# Patient Record
Sex: Male | Born: 1980 | Race: White | Hispanic: No | Marital: Married | Smoking: Never smoker
Health system: Southern US, Community
[De-identification: ages and names within clinical notes are randomized; demographics above are authoritative.]

## PROBLEM LIST (undated history)

## (undated) DIAGNOSIS — R011 Cardiac murmur, unspecified: Secondary | ICD-10-CM

## (undated) DIAGNOSIS — H269 Unspecified cataract: Secondary | ICD-10-CM

## (undated) DIAGNOSIS — R002 Palpitations: Secondary | ICD-10-CM

## (undated) DIAGNOSIS — Z87442 Personal history of urinary calculi: Secondary | ICD-10-CM

## (undated) DIAGNOSIS — K219 Gastro-esophageal reflux disease without esophagitis: Secondary | ICD-10-CM

## (undated) HISTORY — DX: Cardiac murmur, unspecified: R01.1

## (undated) HISTORY — DX: Unspecified cataract: H26.9

## (undated) HISTORY — DX: Palpitations: R00.2

## (undated) HISTORY — PX: APPENDECTOMY: SHX54

## (undated) HISTORY — PX: CATARACT EXTRACTION, BILATERAL: SHX1313

---

## 2004-03-28 ENCOUNTER — Ambulatory Visit: Payer: Self-pay | Admitting: General Surgery

## 2004-04-06 ENCOUNTER — Ambulatory Visit: Payer: Self-pay | Admitting: General Surgery

## 2006-11-05 ENCOUNTER — Emergency Department: Payer: Self-pay

## 2006-11-05 ENCOUNTER — Other Ambulatory Visit: Payer: Self-pay

## 2006-11-14 ENCOUNTER — Ambulatory Visit: Payer: Self-pay

## 2009-05-02 ENCOUNTER — Ambulatory Visit: Payer: Self-pay | Admitting: Family Medicine

## 2012-09-23 ENCOUNTER — Ambulatory Visit: Payer: Self-pay

## 2012-10-12 ENCOUNTER — Ambulatory Visit: Payer: Self-pay | Admitting: Emergency Medicine

## 2012-10-17 ENCOUNTER — Ambulatory Visit: Payer: Self-pay

## 2014-06-05 ENCOUNTER — Ambulatory Visit: Payer: Self-pay | Admitting: Family Medicine

## 2014-06-05 LAB — CBC WITH DIFFERENTIAL/PLATELET
BASOS ABS: 0.1 10*3/uL (ref 0.0–0.1)
Basophil %: 0.9 %
EOS ABS: 0.1 10*3/uL (ref 0.0–0.7)
Eosinophil %: 1.5 %
HCT: 46.2 % (ref 40.0–52.0)
HGB: 15.2 g/dL (ref 13.0–18.0)
LYMPHS ABS: 1.2 10*3/uL (ref 1.0–3.6)
LYMPHS PCT: 19.5 %
MCH: 28.8 pg (ref 26.0–34.0)
MCHC: 33 g/dL (ref 32.0–36.0)
MCV: 87 fL (ref 80–100)
Monocyte #: 0.8 x10 3/mm (ref 0.2–1.0)
Monocyte %: 12.3 %
NEUTROS PCT: 65.8 %
Neutrophil #: 4.2 10*3/uL (ref 1.4–6.5)
Platelet: 191 10*3/uL (ref 150–440)
RBC: 5.29 10*6/uL (ref 4.40–5.90)
RDW: 13.4 % (ref 11.5–14.5)
WBC: 6.4 10*3/uL (ref 3.8–10.6)

## 2014-06-05 LAB — COMPREHENSIVE METABOLIC PANEL
ALBUMIN: 4.1 g/dL (ref 3.4–5.0)
ALK PHOS: 105 U/L
Anion Gap: 9 (ref 7–16)
BUN: 10 mg/dL (ref 7–18)
Bilirubin,Total: 0.7 mg/dL (ref 0.2–1.0)
CALCIUM: 9.3 mg/dL (ref 8.5–10.1)
CHLORIDE: 102 mmol/L (ref 98–107)
CREATININE: 1 mg/dL (ref 0.60–1.30)
Co2: 29 mmol/L (ref 21–32)
EGFR (African American): >60
EGFR (Non-African Amer.): >60
Glucose: 97 mg/dL (ref 65–99)
Osmolality: 278 (ref 275–301)
Potassium: 4.2 mmol/L (ref 3.5–5.1)
SGOT(AST): 17 U/L (ref 15–37)
SGPT (ALT): 28 U/L
Sodium: 140 mmol/L (ref 136–145)
TOTAL PROTEIN: 8 g/dL (ref 6.4–8.2)

## 2014-06-05 LAB — LIPASE, BLOOD: LIPASE: 98 U/L (ref 73–393)

## 2016-02-13 ENCOUNTER — Emergency Department
Admission: EM | Admit: 2016-02-13 | Discharge: 2016-02-13 | Disposition: A | Discharge status: Home / Self Care | Payer: BC Managed Care – PPO | Attending: Emergency Medicine | Admitting: Emergency Medicine

## 2016-02-13 ENCOUNTER — Emergency Department: Payer: BC Managed Care – PPO

## 2016-02-13 ENCOUNTER — Encounter: Payer: Self-pay | Admitting: Emergency Medicine

## 2016-02-13 DIAGNOSIS — N201 Calculus of ureter: Secondary | ICD-10-CM | POA: Insufficient documentation

## 2016-02-13 DIAGNOSIS — R109 Unspecified abdominal pain: Secondary | ICD-10-CM | POA: Diagnosis present

## 2016-02-13 DIAGNOSIS — R52 Pain, unspecified: Secondary | ICD-10-CM

## 2016-02-13 LAB — CBC WITH DIFFERENTIAL/PLATELET
BASOS ABS: 0.1 10*3/uL (ref 0–0.1)
BASOS PCT: 1 %
Eosinophils Absolute: 0.1 10*3/uL (ref 0–0.7)
Eosinophils Relative: 1 %
HEMATOCRIT: 43.1 % (ref 40.0–52.0)
HEMOGLOBIN: 15.2 g/dL (ref 13.0–18.0)
Lymphocytes Relative: 29 %
Lymphs Abs: 2.3 10*3/uL (ref 1.0–3.6)
MCH: 30.1 pg (ref 26.0–34.0)
MCHC: 35.2 g/dL (ref 32.0–36.0)
MCV: 85.5 fL (ref 80.0–100.0)
MONO ABS: 0.7 10*3/uL (ref 0.2–1.0)
Monocytes Relative: 8 %
NEUTROS ABS: 4.8 10*3/uL (ref 1.4–6.5)
NEUTROS PCT: 61 %
Platelets: 208 10*3/uL (ref 150–440)
RBC: 5.04 MIL/uL (ref 4.40–5.90)
RDW: 13.2 % (ref 11.5–14.5)
WBC: 8 10*3/uL (ref 3.8–10.6)

## 2016-02-13 LAB — COMPREHENSIVE METABOLIC PANEL
ALBUMIN: 4.9 g/dL (ref 3.5–5.0)
ALT: 17 U/L (ref 17–63)
AST: 22 U/L (ref 15–41)
Alkaline Phosphatase: 80 U/L (ref 38–126)
Anion gap: 9 (ref 5–15)
BILIRUBIN TOTAL: 0.6 mg/dL (ref 0.3–1.2)
BUN: 24 mg/dL — AB (ref 6–20)
CO2: 26 mmol/L (ref 22–32)
Calcium: 9.5 mg/dL (ref 8.9–10.3)
Chloride: 105 mmol/L (ref 101–111)
Creatinine, Ser: 1.26 mg/dL — ABNORMAL HIGH (ref 0.61–1.24)
GFR calc Af Amer: >60 mL/min (ref >60)
GFR calc non Af Amer: >60 mL/min (ref >60)
GLUCOSE: 137 mg/dL — AB (ref 65–99)
POTASSIUM: 3.5 mmol/L (ref 3.5–5.1)
SODIUM: 140 mmol/L (ref 135–145)
TOTAL PROTEIN: 8 g/dL (ref 6.5–8.1)

## 2016-02-13 MED ORDER — HYDROCODONE-ACETAMINOPHEN 5-325 MG PO TABS: 1.0000 | ORAL_TABLET | ORAL | 0 refills | Status: DC | PRN

## 2016-02-13 MED ORDER — ONDANSETRON HCL 4 MG PO TABS: 4.0000 mg | ORAL_TABLET | Freq: Every day | ORAL | 1 refills | Status: DC | PRN

## 2016-02-13 MED ORDER — SODIUM CHLORIDE 0.9 % IV SOLN
Freq: Once | INTRAVENOUS | Status: AC
  Administered 2016-02-13: 21:00:00 via INTRAVENOUS

## 2016-02-13 MED ORDER — MORPHINE SULFATE (PF) 4 MG/ML IV SOLN
4.0000 mg | Freq: Once | INTRAVENOUS | Status: AC
  Administered 2016-02-13: 4 mg via INTRAVENOUS

## 2016-02-13 MED ORDER — ONDANSETRON HCL 4 MG/2ML IJ SOLN
INTRAMUSCULAR | Status: AC
  Administered 2016-02-13: 4 mg via INTRAVENOUS
  Filled 2016-02-13: qty 2

## 2016-02-13 MED ORDER — OXYCODONE-ACETAMINOPHEN 5-325 MG PO TABS
ORAL_TABLET | ORAL | Status: AC
  Filled 2016-02-13: qty 1

## 2016-02-13 MED ORDER — OXYCODONE-ACETAMINOPHEN 5-325 MG PO TABS
1.0000 | ORAL_TABLET | Freq: Once | ORAL | Status: AC
  Administered 2016-02-13: 1 via ORAL

## 2016-02-13 MED ORDER — ONDANSETRON HCL 4 MG/2ML IJ SOLN
4.0000 mg | Freq: Once | INTRAMUSCULAR | Status: AC
  Administered 2016-02-13: 4 mg via INTRAVENOUS

## 2016-02-13 MED ORDER — TAMSULOSIN HCL 0.4 MG PO CAPS: 0.4000 mg | ORAL_CAPSULE | Freq: Every day | ORAL | 0 refills | Status: DC

## 2016-02-13 MED ORDER — MORPHINE SULFATE (PF) 2 MG/ML IV SOLN
INTRAVENOUS | Status: AC
  Administered 2016-02-13: 4 mg via INTRAMUSCULAR
  Filled 2016-02-13: qty 2

## 2016-02-13 MED ORDER — NAPROXEN 500 MG PO TABS: 500.0000 mg | ORAL_TABLET | Freq: Two times a day (BID) | ORAL | 2 refills | Status: DC

## 2016-02-13 MED ORDER — KETOROLAC TROMETHAMINE 30 MG/ML IJ SOLN
30.0000 mg | Freq: Once | INTRAMUSCULAR | Status: AC
  Administered 2016-02-13: 30 mg via INTRAVENOUS
  Filled 2016-02-13: qty 1

## 2016-02-13 NOTE — ED Provider Notes (Signed)
[facility] Emergency Department Provider Note   ____________________________________________    I have reviewed the triage vital signs and the nursing notes.   HISTORY  Chief Complaint Testicle Pain     HPI Andrew [name] is a 35 y.o. male who presents with complaints ofleft-sided flank pain that radiates into his left testicle. He reports this started abruptly and was severe. He was nauseous and vomited once. He has never had this before. No history of kidney stones. No dysuria. No hematuria. No fevers or chills. He reports the pain as cramping and severe   History reviewed. No pertinent past medical history.  There are no active problems to display for this patient.   History reviewed. No pertinent surgical history.  Prior to Admission medications   Medication Sig Start Date End Date Taking? Authorizing Provider  HYDROcodone-acetaminophen (NORCO/VICODIN) 5-325 MG tablet Take 1 tablet by mouth every 4 (four) hours as needed for moderate pain. 02/13/16   Jene Everyobert Jensen Cheramie, MD  naproxen (NAPROSYN) 500 MG tablet Take 1 tablet (500 mg total) by mouth 2 (two) times daily with a meal. 02/13/16   Jene Everyobert Dwan Hemmelgarn, MD  ondansetron (ZOFRAN) 4 MG tablet Take 1 tablet (4 mg total) by mouth daily as needed for nausea or vomiting. 02/13/16   Jene Everyobert Kady Toothaker, MD  tamsulosin (FLOMAX) 0.4 MG CAPS capsule Take 1 capsule (0.4 mg total) by mouth daily. 02/13/16   Jene Everyobert Albi Rappaport, MD     Allergies Review of patient's allergies indicates no known allergies.  History reviewed. No pertinent family history.  Social History Social History  Substance Use Topics  . Smoking status: Never Smoker  . Smokeless tobacco: Never Used  . Alcohol use Yes     Comment: ocassionally    Review of Systems  Constitutional: No fever/chills   Gastrointestinal: As above Genitourinary: Negative for dysuria. Musculoskeletal: Negative for back pain.    10-point ROS otherwise  negative.  ____________________________________________   PHYSICAL EXAM:  VITAL SIGNS: ED Triage Vitals [02/13/16 2036]  Enc Vitals Group     BP (!) 151/92     Pulse Rate 85     Resp 20     Temp 98.2 F (36.8 C)     Temp src      SpO2 100 %     Weight 230 lb (104.3 kg)     Height 6\' 4"  (1.93 m)     Head Circumference      Peak Flow      Pain Score 8     Pain Loc      Pain Edu?      Excl. in GC?     Constitutional: Alert and oriented. No acute distress.  Eyes: Conjunctivae are normal.  Head: Atraumatic. Nose: No congestion/rhinnorhea.  Cardiovascular: Normal rate, regular rhythm. Grossly normal heart sounds.   Respiratory: Normal respiratory effort.  No retractions.  Gastrointestinal: Soft and nontender. No distention.  No CVA tenderness. Genitourinary: deferred Musculoskeletal: No lower extremity tenderness nor edema.  Warm and well perfused Neurologic:  Normal speech and language. No gross focal neurologic deficits are appreciated.  Skin:  Skin is warm, dry and intact. No rash noted. Psychiatric: Mood and affect are normal. Speech and behavior are normal.  ____________________________________________   LABS (all labs ordered are listed, but only abnormal results are displayed)  Labs Reviewed  COMPREHENSIVE METABOLIC PANEL - Abnormal; Notable for the following:       Result Value   Glucose, Bld 137 (*)  BUN 24 (*)    Creatinine, Ser 1.26 (*)    All other components within normal limits  CBC WITH DIFFERENTIAL/PLATELET  URINALYSIS COMPLETEWITH MICROSCOPIC ([facility] ONLY)   ____________________________________________  EKG  None ____________________________________________  RADIOLOGY  CT renal stone study ____________________________________________   PROCEDURES  Procedure(s) performed: No    Critical Care performed:No ____________________________________________   INITIAL IMPRESSION / ASSESSMENT AND PLAN / ED COURSE  Pertinent labs &  imaging results that were available during my care of the patient were reviewed by me and considered in my medical decision making (see chart for details).  Patient presents with left-sided flank pain radiating to his left testicle, suspicious for ureterolithiasis. IV Toradol, IV Zofran, normal saline, CT renal stone study pending  Clinical Course  Value Comment By Time  AST: 22 (Reviewed) Jene Everyobert Quadre Bristol, MD 08/20 2209  CT renal stone study confirms 1 mm UVJ stone.  Patient is pain-free after IV Toradol. He feels quite well. Discussed with him diagnosis and outpatient management area return precautions discussed ____________________________________________   FINAL CLINICAL IMPRESSION(S) / ED DIAGNOSES  Final diagnoses:  Pain  Flank pain, acute  Ureterolithiasis      NEW MEDICATIONS STARTED DURING THIS VISIT:  Discharge Medication List as of 02/13/2016 10:17 PM    START taking these medications   Details  HYDROcodone-acetaminophen (NORCO/VICODIN) 5-325 MG tablet Take 1 tablet by mouth every 4 (four) hours as needed for moderate pain., Starting Sun 02/13/2016, Print    naproxen (NAPROSYN) 500 MG tablet Take 1 tablet (500 mg total) by mouth 2 (two) times daily with a meal., Starting Sun 02/13/2016, Print    ondansetron (ZOFRAN) 4 MG tablet Take 1 tablet (4 mg total) by mouth daily as needed for nausea or vomiting., Starting Sun 02/13/2016, Print    tamsulosin (FLOMAX) 0.4 MG CAPS capsule Take 1 capsule (0.4 mg total) by mouth daily., Starting Sun 02/13/2016, Print         Note:  This document was prepared using Dragon voice recognition software and may include unintentional dictation errors.    Jene Everyobert Terrall Bley, MD 02/13/16 2245

## 2016-02-13 NOTE — ED Notes (Signed)
Patient transported to Ultrasound 

## 2016-02-13 NOTE — ED Triage Notes (Signed)
Went to the bathroom and tried to urinate - had sudden pain in his left lq abd that moved into his testicle (left).

## 2016-02-13 NOTE — ED Notes (Signed)
Patient reports sudden onset of left lower quad pain that radiated into his left testicle with vomiting.  Patient denies any history of kidney stones.

## 2016-03-09 ENCOUNTER — Encounter: Payer: Self-pay | Admitting: Urology

## 2016-03-09 ENCOUNTER — Ambulatory Visit (INDEPENDENT_AMBULATORY_CARE_PROVIDER_SITE_OTHER): Payer: BC Managed Care – PPO | Admitting: Urology

## 2016-03-09 VITALS — BP 118/75 | HR 87 | Ht 76.0 in | Wt 228.4 lb

## 2016-03-09 DIAGNOSIS — N200 Calculus of kidney: Secondary | ICD-10-CM | POA: Diagnosis not present

## 2016-03-09 LAB — MICROSCOPIC EXAMINATION: BACTERIA UA: NONE SEEN

## 2016-03-09 LAB — URINALYSIS, COMPLETE
BILIRUBIN UA: NEGATIVE
Glucose, UA: NEGATIVE
Ketones, UA: NEGATIVE
LEUKOCYTES UA: NEGATIVE
Nitrite, UA: NEGATIVE
PH UA: 7 (ref 5.0–7.5)
PROTEIN UA: NEGATIVE
RBC, UA: NEGATIVE
Specific Gravity, UA: 1.01 (ref 1.005–1.030)
Urobilinogen, Ur: 0.2 mg/dL (ref 0.2–1.0)

## 2016-03-09 NOTE — Progress Notes (Signed)
03/09/2016 10:40 AM   Andrew [name] 07/29/1980 [id number]  Referring provider: Steele SizerMark A Crissman, MD [address] [place], [place] [id number]  Chief Complaint  Patient presents with  . New Patient (Initial Visit)    1 mm left UVJ calculus with mild obstructive changes.    HPI: The patient is a 35 year old male presents for ER follow up after being diagnosed with a 1 mm left UVJ stone. He no other significant stone burden bilaterally.  His pain was in his left flank radiating to his left testicle. He is normal scrotal ultrasound. His pain is now resolved. He has not seen the stone. He has no nausea or vomiting or pain in his flank or testicle this time. He has never had a kidney stone before. He denies problems with urination or blood in his urine.   PMH: No past medical history on file.  Surgical History: Past Surgical History:  Procedure Laterality Date  . APPENDECTOMY    . CATARACT EXTRACTION, BILATERAL      Home Medications:    Medication List       Accurate as of 03/09/16 10:40 AM. Always use your most recent med list.          HYDROcodone-acetaminophen 5-325 MG tablet Commonly known as:  NORCO/VICODIN Take 1 tablet by mouth every 4 (four) hours as needed for moderate pain.   naproxen 500 MG tablet Commonly known as:  NAPROSYN Take 1 tablet (500 mg total) by mouth 2 (two) times daily with a meal.   ondansetron 4 MG tablet Commonly known as:  ZOFRAN Take 1 tablet (4 mg total) by mouth daily as needed for nausea or vomiting.   tamsulosin 0.4 MG Caps capsule Commonly known as:  FLOMAX Take 1 capsule (0.4 mg total) by mouth daily.       Allergies: No Known Allergies  Family History: Family History  Problem Relation Age of Onset  . Prostate cancer Neg Hx   . Kidney failure Neg Hx     Social History:  reports that he has never smoked. He has never used smokeless tobacco. He reports that he drinks alcohol. He reports that he does not use  drugs.  ROS: UROLOGY Frequent Urination?: No Hard to postpone urination?: No Burning/pain with urination?: No Get up at night to urinate?: No Leakage of urine?: No Urine stream starts and stops?: No Trouble starting stream?: No Do you have to strain to urinate?: No Blood in urine?: No Urinary tract infection?: No Sexually transmitted disease?: No Injury to kidneys or bladder?: No Painful intercourse?: No Weak stream?: No Erection problems?: No Penile pain?: No  Gastrointestinal Nausea?: No Vomiting?: No Indigestion/heartburn?: No Diarrhea?: No Constipation?: No  Constitutional Fever: No Night sweats?: No Weight loss?: No Fatigue?: No  Skin Skin rash/lesions?: No Itching?: No  Eyes Blurred vision?: No Double vision?: No  Ears/Nose/Throat Sore throat?: No Sinus problems?: No  Hematologic/Lymphatic Swollen glands?: No Easy bruising?: No  Cardiovascular Leg swelling?: No Chest pain?: No  Respiratory Cough?: No Shortness of breath?: No  Endocrine Excessive thirst?: No  Musculoskeletal Back pain?: No Joint pain?: No  Neurological Headaches?: No Dizziness?: No  Psychologic Depression?: No Anxiety?: No  Physical Exam: BP 118/75   Pulse 87   Ht 6\' 4"  (1.93 m)   Wt 228 lb 6.4 oz (103.6 kg)   BMI 27.80 kg/m   Constitutional:  Alert and oriented, No acute distress. HEENT: [place] AT, moist mucus membranes.  Trachea midline, no masses. Cardiovascular: No  clubbing, cyanosis, or edema. Respiratory: Normal respiratory effort, no increased work of breathing. GI: Abdomen is soft, nontender, nondistended, no abdominal masses GU: No CVA tenderness. Normal phallus. Testicles descended equal bilaterally. No masses. Skin: No rashes, bruises or suspicious lesions. Lymph: No cervical or inguinal adenopathy. Neurologic: Grossly intact, no focal deficits, moving all 4 extremities. Psychiatric: Normal mood and affect.  Laboratory Data: Lab Results   Component Value Date   WBC 8.0 02/13/2016   HGB 15.2 02/13/2016   HCT 43.1 02/13/2016   MCV 85.5 02/13/2016   PLT 208 02/13/2016    Lab Results  Component Value Date   CREATININE 1.26 (H) 02/13/2016    No results found for: PSA  No results found for: TESTOSTERONE  No results found for: HGBA1C  Urinalysis No results found for: COLORURINE, APPEARANCEUR, LABSPEC, PHURINE, GLUCOSEU, HGBUR, BILIRUBINUR, KETONESUR, PROTEINUR, UROBILINOGEN, NITRITE, LEUKOCYTESUR  Pertinent Imaging: CLINICAL DATA:  Acute left flank and testicular pain.  EXAM: CT ABDOMEN AND PELVIS WITHOUT CONTRAST  TECHNIQUE: Multidetector CT imaging of the abdomen and pelvis was performed following the standard protocol without IV contrast.  COMPARISON:  None available  FINDINGS: Lower chest and abdominal wall:  No contributory findings.  Hepatobiliary: 1 cm presumed cyst in the left liver.No evidence of biliary obstruction or stone.  Pancreas: Unremarkable.  Spleen: Unremarkable.  Adrenals/Urinary Tract: Negative adrenals. 1 mm stone at the left UVJ with mild left hydronephrosis. Low-density expansion and mild perinephric edema on the left may also be postobstructive. Punctate stone in the upper pole right kidney. Unremarkable bladder.  Stomach/Bowel:  No obstruction. Appendectomy.  Reproductive:No pathologic findings.  Vascular/Lymphatic: Negative  No mass or adenopathy.  Other: No ascites or pneumoperitoneum.  Musculoskeletal: Negative  IMPRESSION: 1. 1 mm left UVJ calculus with mild obstructive changes. 2. Punctate right nephrolithiasis.  Assessment & Plan:    1. Left ureteral calculus The patient has clinically passed his small stone. No further intervention is indicated at this time. He'll follow-up with [place] as needed.   Return if symptoms worsen or fail to improve.  Hildred Laser, MD  [facility] [address] [place], [place] [address] [phone]

## 2018-09-22 ENCOUNTER — Ambulatory Visit
Admission: EM | Admit: 2018-09-22 | Discharge: 2018-09-22 | Disposition: A | Discharge status: Home / Self Care | Payer: BC Managed Care – PPO | Attending: Family Medicine | Admitting: Family Medicine

## 2018-09-22 ENCOUNTER — Other Ambulatory Visit: Payer: Self-pay

## 2018-09-22 DIAGNOSIS — S81012A Laceration without foreign body, left knee, initial encounter: Secondary | ICD-10-CM | POA: Diagnosis not present

## 2018-09-22 DIAGNOSIS — W293XXA Contact with powered garden and outdoor hand tools and machinery, initial encounter: Secondary | ICD-10-CM

## 2018-09-22 DIAGNOSIS — Z23 Encounter for immunization: Secondary | ICD-10-CM

## 2018-09-22 MED ORDER — MUPIROCIN 2 % EX OINT: 1.0000 "application " | TOPICAL_OINTMENT | Freq: Two times a day (BID) | CUTANEOUS | 0 refills | Status: AC

## 2018-09-22 MED ORDER — TETANUS-DIPHTH-ACELL PERTUSSIS 5-2.5-18.5 LF-MCG/0.5 IM SUSP
0.5000 mL | Freq: Once | INTRAMUSCULAR | Status: AC
  Administered 2018-09-22: 0.5 mL via INTRAMUSCULAR

## 2018-09-22 NOTE — Discharge Instructions (Signed)
Sutures out in 10 days.   Antibiotic ointment twice daily.   Take care  Dr. Adriana Simas

## 2018-09-22 NOTE — ED Provider Notes (Signed)
[facility]    CSN: [id number] Arrival date & time: 09/22/18  1419  History   Chief Complaint Chief Complaint  Patient presents with  . Laceration   HPI  38 year old male presents with a left knee laceration.  Patient was using a chainsaw today.  Accidentally cut his left knee.  Occurred approximately 1 hour prior to arrival.  Bleeding is well controlled.  He does note that when he bends his knee wound opens up and bleeds.  He states that his last tetanus was greater than 5 years but less that 10 years.  Mild pain at this time.  No other associated symptoms.  PMH, Surgical Hx, Family Hx, Social History reviewed and updated as below.  PMH: Nephrolithiasis  Past Surgical History:  Procedure Laterality Date  . APPENDECTOMY    . CATARACT EXTRACTION, BILATERAL     Home Medications    Prior to Admission medications   Medication Sig Start Date End Date Taking? Authorizing Provider  mupirocin ointment (BACTROBAN) 2 % Apply 1 application topically 2 (two) times daily for 7 days. 09/22/18 09/29/18  Tommie Sams, DO    Family History Family History  Problem Relation Age of Onset  . Prostate cancer Neg Hx   . Kidney failure Neg Hx     Social History Social History   Tobacco Use  . Smoking status: Never Smoker  . Smokeless tobacco: Never Used  Substance Use Topics  . Alcohol use: Yes    Comment: occasionally  . Drug use: No     Allergies   Patient has no known allergies.   Review of Systems Review of Systems  Constitutional: Negative.   Skin: Positive for wound.   Physical Exam Triage Vital Signs ED Triage Vitals  Enc Vitals Group     BP 09/22/18 1429 126/81     Pulse Rate 09/22/18 1429 99     Resp 09/22/18 1429 16     Temp 09/22/18 1429 98.2 F (36.8 C)     Temp Source 09/22/18 1429 Oral     SpO2 09/22/18 1429 98 %     Weight 09/22/18 1430 235 lb (106.6 kg)     Height 09/22/18 1430 6\' 4"  (1.93 m)     Head Circumference --      Peak Flow --       Pain Score 09/22/18 1430 3     Pain Loc --      Pain Edu? --      Excl. in GC? --     Updated Vital Signs BP 126/81 (BP Location: Left Arm)   Pulse 99   Temp 98.2 F (36.8 C) (Oral)   Resp 16   Ht 6\' 4"  (1.93 m)   Wt 106.6 kg   SpO2 98%   BMI 28.61 kg/m   Visual Acuity Right Eye Distance:   Left Eye Distance:   Bilateral Distance:    Right Eye Near:   Left Eye Near:    Bilateral Near:     Physical Exam Vitals signs and nursing note reviewed.  Constitutional:      General: He is not in acute distress.    Appearance: Normal appearance.  HENT:     Head: Normocephalic and atraumatic.  Eyes:     General: No scleral icterus.    Conjunctiva/sclera: Conjunctivae normal.  Pulmonary:     Effort: Pulmonary effort is normal. No respiratory distress.  Skin:    Comments: Left anterior knee with a 3 cm linear laceration.  Neurological:     Mental Status: He is alert.  Psychiatric:        Mood and Affect: Mood normal.        Behavior: Behavior normal.    UC Treatments / Results  Labs (all labs ordered are listed, but only abnormal results are displayed) Labs Reviewed - No data to display  EKG None  Radiology No results found.  Procedures Laceration Repair Date/Time: 09/22/2018 3:27 PM Performed by: Tommie Sams, DO Authorized by: Tommie Sams, DO   Consent:    Consent obtained:  Verbal   Consent given by:  Patient Anesthesia (see MAR for exact dosages):    Anesthesia method:  Local infiltration   Local anesthetic:  Lidocaine 1% WITH epi and lidocaine 1% w/o epi Laceration details:    Location:  Leg   Leg location:  L knee   Length (cm):  3 Repair type:    Repair type:  Simple Pre-procedure details:    Preparation:  Patient was prepped and draped in usual sterile fashion Exploration:    Hemostasis achieved with:  Direct pressure   Wound exploration: entire depth of wound probed and visualized     Contaminated: yes   Treatment:    Area cleansed  with:  Betadine   Amount of cleaning:  Standard   Irrigation solution:  Sterile water   Irrigation method:  Syringe   Visualized foreign bodies/material removed: yes   Skin repair:    Repair method:  Sutures   Suture size:  3-0   Suture material:  Nylon   Suture technique:  Simple interrupted   Number of sutures:  3 Approximation:    Approximation:  Close Post-procedure details:    Dressing:  Antibiotic ointment   Patient tolerance of procedure:  Tolerated well, no immediate complications   (including critical care time)  Medications Ordered in UC Medications  Tdap (BOOSTRIX) injection 0.5 mL (0.5 mLs Intramuscular Given 09/22/18 1508)    Initial Impression / Assessment and Plan / UC Course  I have reviewed the triage vital signs and the nursing notes.  Pertinent labs & imaging results that were available during my care of the patient were reviewed by me and considered in my medical decision making (see chart for details).    38 year old male presents with a laceration.  Repaired as above.  Bactroban ointment as prescribed.  Sutures out in 10 days.  Tetanus given today.  Final Clinical Impressions(s) / UC Diagnoses   Final diagnoses:  Laceration of left knee, initial encounter     Discharge Instructions     Sutures out in 10 days.   Antibiotic ointment twice daily.   Take care  Dr. Adriana Simas    ED Prescriptions    Medication Sig Dispense Auth. Provider   mupirocin ointment (BACTROBAN) 2 % Apply 1 application topically 2 (two) times daily for 7 days. 22 g Tommie Sams, DO     Controlled Substance Prescriptions [place] Controlled Substance Registry consulted? Not Applicable   Tommie Sams, DO 09/22/18 1528

## 2018-09-22 NOTE — ED Triage Notes (Signed)
Patient complains of laceration to his left knee that occurred by a chainsaw around less than 1 hour ago.

## 2018-10-03 ENCOUNTER — Encounter: Payer: Self-pay | Admitting: Emergency Medicine

## 2018-10-03 ENCOUNTER — Ambulatory Visit
Admission: EM | Admit: 2018-10-03 | Discharge: 2018-10-03 | Disposition: A | Discharge status: Home / Self Care | Payer: BC Managed Care – PPO

## 2018-10-03 ENCOUNTER — Other Ambulatory Visit: Payer: Self-pay

## 2018-10-03 NOTE — ED Triage Notes (Signed)
Pt presents to [facility] for suture removal of left knee. 3 sutures were placed on 09/22/18. 3 sutures removed without difficulty. Pt tolerated procedure well. Wound healing well.

## 2019-05-19 NOTE — Progress Notes (Signed)
[facility] [address] [place], [place] [address] Phone: [phone] Subjective:    I'm seeing this patient by the request  of:  Crissman, Jeannette How, MD   This visit occurred during the SARS-CoV-2 public health emergency.  Safety protocols were in place, including screening questions prior to the visit, additional usage of staff PPE, and extensive cleaning of exam room while observing appropriate contact time as indicated for disinfecting solutions.     CC: Shoulder pain  [id number]  Andrew [name] is a 38 y.o. male coming in with complaint of right shoulder pain for 2 months. Pain worse with sitting. Pain can radiate into elbow but otherwise is throughout the entire joint. Is working at home more and sitting in different office chairs. Denies any neck pain. Uses Aleve prn.       No past medical history on file. Past Surgical History:  Procedure Laterality Date  . APPENDECTOMY    . CATARACT EXTRACTION, BILATERAL     Social History   Socioeconomic History  . Marital status: Married    Spouse name: Not on file  . Number of children: Not on file  . Years of education: Not on file  . Highest education level: Not on file  Occupational History  . Not on file  Social Needs  . Financial resource strain: Not on file  . Food insecurity    Worry: Not on file    Inability: Not on file  . Transportation needs    Medical: Not on file    Non-medical: Not on file  Tobacco Use  . Smoking status: Never Smoker  . Smokeless tobacco: Never Used  Substance and Sexual Activity  . Alcohol use: Yes    Comment: occasionally  . Drug use: No  . Sexual activity: Not on file  Lifestyle  . Physical activity    Days per week: Not on file    Minutes per session: Not on file  . Stress: Not on file  Relationships  . Social Herbalist on phone: Not on file    Gets together: Not on file    Attends religious service: Not on file    Active member  of club or organization: Not on file    Attends meetings of clubs or organizations: Not on file    Relationship status: Not on file  Other Topics Concern  . Not on file  Social History Narrative  . Not on file   No Known Allergies Family History  Problem Relation Age of Onset  . Healthy Mother   . Healthy Father   . Prostate cancer Neg Hx   . Kidney failure Neg Hx     Current Outpatient Medications (Endocrine & Metabolic):  .  predniSONE (DELTASONE) 50 MG tablet, Take one tablet daily for the next 5 days.      Current Outpatient Medications (Other):  .  gabapentin (NEURONTIN) 100 MG capsule, Take 2 capsules (200 mg total) by mouth at bedtime.    Past medical history, social, surgical and family history all reviewed in electronic medical record.  No pertanent information unless stated regarding to the chief complaint.   Review of Systems:  No headache, visual changes, nausea, vomiting, diarrhea, constipation, dizziness, abdominal pain, skin rash, fevers, chills, night sweats, weight loss, swollen lymph nodes, body aches, joint swelling,  chest pain, shortness of breath, mood changes.  Positive muscle aches  Objective  Blood pressure 108/70, pulse (!) 102, height  6\' 4"  (1.93 m), weight 230 lb (104.3 kg), SpO2 98 %.    General: No apparent distress alert and oriented x3 mood and affect normal, dressed appropriately.  HEENT: Pupils equal, extraocular movements intact  Respiratory: Patient's speak in full sentences and does not appear short of breath  Cardiovascular: No lower extremity edema, non tender, no erythema  Skin: Warm dry intact with no signs of infection or rash on extremities or on axial skeleton.  Abdomen: Soft nontender  Neuro: Cranial nerves II through XII are intact, neurovascularly intact in all extremities with 2+ DTRs and 2+ pulses.  Lymph: No lymphadenopathy of posterior or anterior cervical chain or axillae bilaterally.  Gait normal with good balance and  coordination.  MSK:  tender with limited range of motion and good stability and symmetric strength and tone of  elbows, wrist, hip, knee and ankles bilaterally.  Shoulder: Right Inspection reveals high riding shoulder. Palpation is normal with no tenderness over AC joint or bicipital groove. ROM is full in all planes. Rotator cuff strength normal throughout. Positive signs of impingement with negative Neer and Hawkin's tests, empty can sign. Speeds and Yergason's tests normal. No labral pathology noted with negative Obrien's, negative clunk and good stability. Normal scapular function observed. No painful arc and no drop arm sign. No apprehension sign Contralateral shoulder unremarkable  Neck exam does have loss of lordosis, positive Spurling's on the right side.  Radicular symptoms in the C8 distribution.  Weakness noted in the C8 distribution on the right compared to contralateral side.  No atrophy noted.  [id number]; 15 additional minutes spent for Therapeutic exercises as stated in above notes.  This included exercises focusing on stretching, strengthening, with significant focus on eccentric aspects.   Long term goals include an improvement in range of motion, strength, endurance as well as avoiding reinjury. Patient's frequency would include in 1-2 times a day, 3-5 times a week for a duration of 6-12 weeks. Exercises that included:  Basic scapular stabilization to include adduction and depression of scapula Scaption, focusing on proper movement and good control Internal and External rotation utilizing a theraband, with elbow tucked at side entire time Rows with theraband    Proper technique shown and discussed handout in great detail with ATC.  All questions were discussed and answered.       Impression and Recommendations:     This case required medical decision making of moderate complexity. The above documentation has been reviewed and is accurate and complete , DO        Note: This dictation was prepared with Dragon dictation along with smaller phrase technology. Any transcriptional errors that result from this process are unintentional.

## 2019-05-20 ENCOUNTER — Other Ambulatory Visit: Payer: Self-pay

## 2019-05-20 ENCOUNTER — Encounter: Payer: Self-pay | Admitting: Family Medicine

## 2019-05-20 ENCOUNTER — Ambulatory Visit: Payer: Self-pay

## 2019-05-20 ENCOUNTER — Ambulatory Visit (INDEPENDENT_AMBULATORY_CARE_PROVIDER_SITE_OTHER): Payer: BC Managed Care – PPO | Admitting: Family Medicine

## 2019-05-20 ENCOUNTER — Ambulatory Visit (INDEPENDENT_AMBULATORY_CARE_PROVIDER_SITE_OTHER)
Admission: RE | Admit: 2019-05-20 | Discharge: 2019-05-20 | Disposition: A | Discharge status: Home / Self Care | Payer: BC Managed Care – PPO | Source: Ambulatory Visit | Attending: Family Medicine | Admitting: Family Medicine

## 2019-05-20 VITALS — BP 108/70 | HR 102 | Ht 76.0 in | Wt 230.0 lb

## 2019-05-20 DIAGNOSIS — G8929 Other chronic pain: Secondary | ICD-10-CM

## 2019-05-20 DIAGNOSIS — M25511 Pain in right shoulder: Secondary | ICD-10-CM | POA: Diagnosis not present

## 2019-05-20 DIAGNOSIS — M542 Cervicalgia: Secondary | ICD-10-CM | POA: Diagnosis not present

## 2019-05-20 DIAGNOSIS — M5412 Radiculopathy, cervical region: Secondary | ICD-10-CM | POA: Insufficient documentation

## 2019-05-20 MED ORDER — GABAPENTIN 100 MG PO CAPS: 200.0000 mg | ORAL_CAPSULE | Freq: Every day | ORAL | 0 refills | Status: DC

## 2019-05-20 MED ORDER — PREDNISONE 50 MG PO TABS: ORAL_TABLET | ORAL | 0 refills | Status: DC

## 2019-05-20 NOTE — Patient Instructions (Addendum)
Good to see you.  Ice 20 minutes 2 times daily. Usually after activity and before bed. Exercises 3 times a week  Gabapentin 200 mg at night  Prednisone for next 5 days Volatren gel 2x a day as needed See me again in 5 weeks

## 2019-05-20 NOTE — Assessment & Plan Note (Signed)
Patient does have more of a cervical radiculopathy.  Patient had some weakness more in the C8 distribution.  Patient does have what appears to be a high riding glenohumeral joint but full strength of the rotator cuff noted.  Ultrasound of the rotator cuff does show some very mild degenerative changes but nothing severe enough to cause patient's pain.  Positive Spurling's test noted today.  Prednisone, gabapentin given, x-rays pending.  Worsening symptoms especially weakness we will need to consider advanced imaging.  Follow-up again in 3 to 4 weeks

## 2019-05-21 ENCOUNTER — Other Ambulatory Visit: Payer: Self-pay

## 2019-05-21 DIAGNOSIS — M5412 Radiculopathy, cervical region: Secondary | ICD-10-CM

## 2019-05-29 ENCOUNTER — Ambulatory Visit: Payer: BC Managed Care – PPO | Admitting: Family Medicine

## 2019-05-31 ENCOUNTER — Other Ambulatory Visit: Payer: Self-pay

## 2019-05-31 ENCOUNTER — Ambulatory Visit
Admission: RE | Admit: 2019-05-31 | Discharge: 2019-05-31 | Disposition: A | Discharge status: Home / Self Care | Payer: BC Managed Care – PPO | Source: Ambulatory Visit | Attending: Family Medicine | Admitting: Family Medicine

## 2019-05-31 DIAGNOSIS — M5412 Radiculopathy, cervical region: Secondary | ICD-10-CM

## 2019-06-03 ENCOUNTER — Other Ambulatory Visit: Payer: Self-pay

## 2019-06-03 DIAGNOSIS — M5412 Radiculopathy, cervical region: Secondary | ICD-10-CM

## 2019-06-05 ENCOUNTER — Other Ambulatory Visit: Payer: Self-pay

## 2019-06-05 ENCOUNTER — Ambulatory Visit
Admission: RE | Admit: 2019-06-05 | Discharge: 2019-06-05 | Disposition: A | Discharge status: Home / Self Care | Payer: BC Managed Care – PPO | Source: Ambulatory Visit | Attending: Family Medicine | Admitting: Family Medicine

## 2019-06-05 DIAGNOSIS — M5412 Radiculopathy, cervical region: Secondary | ICD-10-CM

## 2019-06-05 MED ORDER — TRIAMCINOLONE ACETONIDE 40 MG/ML IJ SUSP (RADIOLOGY)
60.0000 mg | Freq: Once | INTRAMUSCULAR | Status: AC
  Administered 2019-06-05: 60 mg via EPIDURAL

## 2019-06-05 MED ORDER — IOPAMIDOL (ISOVUE-M 300) INJECTION 61%
1.0000 mL | Freq: Once | INTRAMUSCULAR | Status: AC
  Administered 2019-06-05: 1 mL via EPIDURAL

## 2019-06-05 NOTE — Discharge Instructions (Signed)

## 2019-07-03 ENCOUNTER — Ambulatory Visit (INDEPENDENT_AMBULATORY_CARE_PROVIDER_SITE_OTHER): Payer: BC Managed Care – PPO | Admitting: Family Medicine

## 2019-07-03 ENCOUNTER — Ambulatory Visit (INDEPENDENT_AMBULATORY_CARE_PROVIDER_SITE_OTHER): Payer: BC Managed Care – PPO

## 2019-07-03 ENCOUNTER — Other Ambulatory Visit: Payer: Self-pay

## 2019-07-03 ENCOUNTER — Encounter: Payer: Self-pay | Admitting: Family Medicine

## 2019-07-03 VITALS — BP 126/80 | HR 77 | Ht 76.0 in | Wt 246.0 lb

## 2019-07-03 DIAGNOSIS — M999 Biomechanical lesion, unspecified: Secondary | ICD-10-CM | POA: Diagnosis not present

## 2019-07-03 DIAGNOSIS — M25511 Pain in right shoulder: Secondary | ICD-10-CM

## 2019-07-03 DIAGNOSIS — G8929 Other chronic pain: Secondary | ICD-10-CM

## 2019-07-03 DIAGNOSIS — M5412 Radiculopathy, cervical region: Secondary | ICD-10-CM | POA: Diagnosis not present

## 2019-07-03 NOTE — Assessment & Plan Note (Signed)
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  90% better initially.  Attempted to osteopathic manipulation today.  Discussed which activities to doing which wants to avoid.  If patient continues to have trouble we can consider another epidural.  Follow-up with me again in 4 weeks

## 2019-07-03 NOTE — Progress Notes (Signed)
[facility] [address] [place] Phone: [phone] Subjective:   I Andrew [name] am serving as a Education administrator for Dr. Hulan Saas.  This visit occurred during the SARS-CoV-2 public health emergency.  Safety protocols were in place, including screening questions prior to the visit, additional usage of staff PPE, and extensive cleaning of exam room while observing appropriate contact time as indicated for disinfecting solutions.   I'm seeing this patient by the request  of:    CC: Neck pain follow-up  [id number]   05/20/2019 Patient does have more of a cervical radiculopathy.  Patient had some weakness more in the C8 distribution.  Patient does have what appears to be a high riding glenohumeral joint but full strength of the rotator cuff noted.  Ultrasound of the rotator cuff does show some very mild degenerative changes but nothing severe enough to cause patient's pain.  Positive Spurling's test noted today.  Prednisone, gabapentin given, x-rays pending.  Worsening symptoms especially weakness we will need to consider advanced imaging.  Follow-up again in 3 to 4 weeks  Update 07/03/2019 Andrew [name] is a 39 y.o. male coming in with complaint of neck and shoulder pain. Patient states last weekend he started to feel the pain again. With sitting the pain is worse.  Patient continues to have discomfort and pain overall.  Patient states that the epidural that was given did help and was 90% better.  Still approximately 40% better than previous exam patient now is wondering what else he can do so he can be nearly pain-free.  Did get an adjustable standing desk which has been helping but still with more fatigue noted.       No past medical history on file. Past Surgical History:  Procedure Laterality Date  . APPENDECTOMY    . CATARACT EXTRACTION, BILATERAL     Social History   Socioeconomic History  . Marital status: Married    Spouse  name: Not on file  . Number of children: Not on file  . Years of education: Not on file  . Highest education level: Not on file  Occupational History  . Not on file  Tobacco Use  . Smoking status: Never Smoker  . Smokeless tobacco: Never Used  Substance and Sexual Activity  . Alcohol use: Yes    Comment: occasionally  . Drug use: No  . Sexual activity: Not on file  Other Topics Concern  . Not on file  Social History Narrative  . Not on file   Social Determinants of Health   Financial Resource Strain:   . Difficulty of Paying Living Expenses: Not on file  Food Insecurity:   . Worried About Charity fundraiser in the Last Year: Not on file  . Ran Out of Food in the Last Year: Not on file  Transportation Needs:   . Lack of Transportation (Medical): Not on file  . Lack of Transportation (Non-Medical): Not on file  Physical Activity:   . Days of Exercise per Week: Not on file  . Minutes of Exercise per Session: Not on file  Stress:   . Feeling of Stress : Not on file  Social Connections:   . Frequency of Communication with Friends and Family: Not on file  . Frequency of Social Gatherings with Friends and Family: Not on file  . Attends Religious Services: Not on file  . Active Member of Clubs or Organizations: Not on file  . Attends Club or  Organization Meetings: Not on file  . Marital Status: Not on file   No Known Allergies Family History  Problem Relation Age of Onset  . Healthy Mother   . Healthy Father   . Prostate cancer Neg Hx   . Kidney failure Neg Hx     Current Outpatient Medications (Endocrine & Metabolic):  .  predniSONE (DELTASONE) 50 MG tablet, Take one tablet daily for the next 5 days.      Current Outpatient Medications (Other):  .  gabapentin (NEURONTIN) 100 MG capsule, Take 2 capsules (200 mg total) by mouth at bedtime.    Past medical history, social, surgical and family history all reviewed in electronic medical record.  No pertanent  information unless stated regarding to the chief complaint.   Review of Systems:  No headache, visual changes, nausea, vomiting, diarrhea, constipation, dizziness, abdominal pain, skin rash, fevers, chills, night sweats, weight loss, swollen lymph nodes, body aches, joint swelling, muscle aches, chest pain, shortness of breath, mood changes.   Objective  Blood pressure 126/80, pulse 77, height 6\' 4"  (1.93 m), weight 246 lb (111.6 kg), SpO2 98 %.    General: No apparent distress alert and oriented x3 mood and affect normal, dressed appropriately.  HEENT: Pupils equal, extraocular movements intact  Respiratory: Patient's speak in full sentences and does not appear short of breath  Cardiovascular: No lower extremity edema, non tender, no erythema  Skin: Warm dry intact with no signs of infection or rash on extremities or on axial skeleton.  Abdomen: Soft nontender  Neuro: Cranial nerves II through XII are intact, neurovascularly intact in all extremities with 2+ DTRs and 2+ pulses.  Lymph: No lymphadenopathy of posterior or anterior cervical chain or axillae bilaterally.  Gait normal with good balance and coordination.  MSK:  Non tender with full range of motion and good stability and symmetric strength and tone of , elbows, wrist, hip, knee and ankles bilaterally.  Neck exam still has some mild loss of lordosis.  Some very mild positive Spurling's on the right side radicular symptoms is significantly less.  5-5 strength of the upper extremities bilaterally.  Neck pain increased with extension greater than 10 degrees and rotation of 5 degrees to the left.  Osteopathic findings C3 flexed rotated and side bent right C6 flexed rotated and side bent left T3 extended rotated and side bent right inhaled third rib     Impression and Recommendations:     This case required medical decision making of moderate complexity. The above documentation has been reviewed and is accurate and complete  , DO       Note: This dictation was prepared with Dragon dictation along with smaller phrase technology. Any transcriptional errors that result from this process are unintentional.

## 2019-07-03 NOTE — Patient Instructions (Signed)
Tried manipulation today If not better call us we will get epidural Continue exercises See me again in 4 weeks

## 2019-07-03 NOTE — Assessment & Plan Note (Signed)
Decision today to treat with OMT was based on Physical Exam  After verbal consent patient was treated with HVLA, ME, FPR techniques in cervical, thoracic, rib areas  Patient tolerated the procedure well with improvement in symptoms  Patient given exercises, stretches and lifestyle modifications  See medications in patient instructions if given  Patient will follow up in 4-8 weeks 

## 2019-08-07 ENCOUNTER — Other Ambulatory Visit: Payer: Self-pay

## 2019-08-07 ENCOUNTER — Encounter: Payer: Self-pay | Admitting: Family Medicine

## 2019-08-07 ENCOUNTER — Ambulatory Visit: Payer: BC Managed Care – PPO | Admitting: Family Medicine

## 2019-08-07 VITALS — BP 116/82 | HR 92 | Ht 76.0 in | Wt 245.0 lb

## 2019-08-07 DIAGNOSIS — M5412 Radiculopathy, cervical region: Secondary | ICD-10-CM

## 2019-08-07 DIAGNOSIS — M999 Biomechanical lesion, unspecified: Secondary | ICD-10-CM

## 2019-08-07 NOTE — Assessment & Plan Note (Signed)
Decision today to treat with OMT was based on Physical Exam  After verbal consent patient was treated with HVLA, ME, FPR techniques in cervical, thoracic, rib, l areas  Patient tolerated the procedure well with improvement in symptoms  Patient given exercises, stretches and lifestyle modifications  See medications in patient instructions if given  Patient will follow up in 4-8 weeks 

## 2019-08-07 NOTE — Assessment & Plan Note (Signed)
Still some mild tightness overall.  Discussed icing regimen and home exercises, discussed ergonomics.  Patient will have a chronic problem but does seem to be stable at the moment.  Noticed social determinants of health including education, increased stressors at home with having to do on school learning and being at the computer more.  Discussed ergonomics and guidance.  Follow-up again in 4 to 5 weeks

## 2019-08-07 NOTE — Patient Instructions (Signed)
Doing well Keep up the exercises See me again in 6-7 weeks

## 2019-08-07 NOTE — Progress Notes (Signed)
[name] [address] [place] [address] Phone: [phone] Subjective:   Andrew [name], am serving as a scribe for Dr. Antoine Primas.  This visit occurred during the SARS-CoV-2 public health emergency.  Safety protocols were in place, including screening questions prior to the visit, additional usage of staff PPE, and extensive cleaning of exam room while observing appropriate contact time as indicated for disinfecting solutions.    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  90% better initially.  Attempted to osteopathic manipulation today.  Discussed which activities to doing which wants to avoid.  If patient continues to have trouble we can consider another epidural.  Follow-up with me again in 4 weeks  Update 08/07/2019 Rolando Hessling is a 39 y.o. male coming in with complaint of neck pain. Patient did get OMT last visit. Patient states that his pain is improving. Had increase in pain last week as he was working around his house. Does feel like manipulation helped as he did not have to get a second epidural.  Overall patient thinks he is making some progress.  Does notice it when he sits in front of the computer a long amount of time some more in discomfort.     History reviewed. No pertinent past medical history. Past Surgical History:  Procedure Laterality Date  . APPENDECTOMY    . CATARACT EXTRACTION, BILATERAL     Social History   Socioeconomic History  . Marital status: Married    Spouse name: Not on file  . Number of children: Not on file  . Years of education: Not on file  . Highest education level: Not on file  Occupational History  . Not on file  Tobacco Use  . Smoking status: Never Smoker  . Smokeless tobacco: Never Used    Substance and Sexual Activity  . Alcohol use: Yes    Comment: occasionally  . Drug use: No  . Sexual activity: Not on file  Other Topics Concern  . Not on file  Social History Narrative  . Not on file   Social Determinants of Health   Financial Resource Strain:   . Difficulty of Paying Living Expenses: Not on file  Food Insecurity:   . Worried About Programme researcher, broadcasting/film/video in the Last Year: Not on file  . Ran Out of Food in the Last Year: Not on file  Transportation Needs:   . Lack of Transportation (Medical): Not on file  . Lack of Transportation (Non-Medical): Not on file  Physical Activity:   . Days of Exercise per Week: Not on file  . Minutes of Exercise per Session: Not on file  Stress:   . Feeling of Stress : Not on file  Social Connections:   . Frequency of Communication with Friends and Family: Not on file  . Frequency of Social Gatherings with Friends and Family: Not on file  . Attends Religious Services: Not on file  . Active Member of Clubs or Organizations: Not on file  . Attends Banker Meetings: Not on file  . Marital Status: Not on file   No Known Allergies Family History  Problem Relation Age of Onset  . Healthy Mother   . Healthy Father   . Prostate cancer Neg  Hx   . Kidney failure Neg Hx     Current Outpatient Medications (Endocrine & Metabolic):  .  predniSONE (DELTASONE) 50 MG tablet, Take one tablet daily for the next 5 days.      Current Outpatient Medications (Other):  .  gabapentin (NEURONTIN) 100 MG capsule, Take 2 capsules (200 mg total) by mouth at bedtime.   Reviewed prior external information including notes and imaging from  primary care provider As well as notes that were available from care everywhere and other healthcare systems.  Past medical history, social, surgical and family history all reviewed in electronic medical record.  No pertanent information unless stated regarding to the chief complaint.   Review of  Systems:  No headache, visual changes, nausea, vomiting, diarrhea, constipation, dizziness, abdominal pain, skin rash, fevers, chills, night sweats, weight loss, swollen lymph nodes, body aches, joint swelling, chest pain, shortness of breath, mood changes. POSITIVE muscle aches  Objective  Blood pressure 116/82, pulse 92, height 6\' 4"  (1.93 m), weight 245 lb (111.1 kg), SpO2 98 %.   General: No apparent distress alert and oriented x3 mood and affect normal, dressed appropriately.  HEENT: Pupils equal, extraocular movements intact  Respiratory: Patient's speak in full sentences and does not appear short of breath  Cardiovascular: No lower extremity edema, non tender, no erythema  Skin: Warm dry intact with no signs of infection or rash on extremities or on axial skeleton.  Abdomen: Soft nontender  Neuro: Cranial nerves II through XII are intact, neurovascularly intact in all extremities with 2+ DTRs and 2+ pulses.  Lymph: No lymphadenopathy of posterior or anterior cervical chain or axillae bilaterally.  Gait normal with good balance and coordination.  MSK:  tender with mild limited range of motion and good stability and symmetric strength and tone of shoulders, elbows, wrist, hip, knee and ankles bilaterally.  Neck exam does have some loss of lordosis, tender to palpation in the paraspinal musculature lumbar spine right greater than left.  Negative Spurling's noted today.  Grip strength is improved.  Osteopathic findings  C2 flexed rotated and side bent right C7 flexed rotated and side bent left T3 extended rotated and side bent right inhaled third rib T5 extended rotated and side bent left     Impression and Recommendations:     This case required medical decision making of moderate complexity. The above documentation has been reviewed and is accurate and complete Lyndal Pulley, DO       Note: This dictation was prepared with Dragon dictation along with smaller phrase  technology. Any transcriptional errors that result from this process are unintentional.

## 2019-09-18 ENCOUNTER — Ambulatory Visit: Payer: BC Managed Care – PPO | Admitting: Family Medicine

## 2019-09-22 ENCOUNTER — Encounter: Payer: Self-pay | Admitting: Family Medicine

## 2019-09-22 ENCOUNTER — Other Ambulatory Visit: Payer: Self-pay

## 2019-09-22 ENCOUNTER — Ambulatory Visit: Payer: BC Managed Care – PPO | Admitting: Family Medicine

## 2019-09-22 VITALS — BP 124/80 | HR 76 | Ht 76.0 in | Wt 247.0 lb

## 2019-09-22 DIAGNOSIS — M5412 Radiculopathy, cervical region: Secondary | ICD-10-CM | POA: Diagnosis not present

## 2019-09-22 DIAGNOSIS — M999 Biomechanical lesion, unspecified: Secondary | ICD-10-CM

## 2019-09-22 NOTE — Assessment & Plan Note (Signed)
   Decision today to treat with OMT was based on Physical Exam  After verbal consent patient was treated with HVLA, ME, FPR techniques in cervical, thoracic, rib,  all areas are chronic   Patient tolerated the procedure well with improvement in symptoms  Patient given exercises, stretches and lifestyle modifications  See medications in patient instructions if given  Patient will follow up in 4-8 weeks 

## 2019-09-22 NOTE — Progress Notes (Signed)
[facility] [address] [place] Phone: [phone] Subjective:   I Andrew [name] am serving as a Education administrator for Dr. Hulan [name].  This visit occurred during the SARS-CoV-2 public health emergency.  Safety protocols were in place, including screening questions prior to the visit, additional usage of staff PPE, and extensive cleaning of exam room while observing appropriate contact time as indicated for disinfecting solutions.   I'm seeing this patient by the request  of:  [name], Andrew How, MD  CC: Neck pain follow-up  [id number]  Andrew [name] is a 39 y.o. male coming in with complaint of neck pain. Last seen on 08/07/2019 for OMT. Patient states he has right shoulder pain today. Patient states he is doing well. Pain is not as bad as before.  Discussed with patient in great length with him has been doing the home exercises on a more regular basis.  Is doing a new activity at work.      No past medical history on file. Past Surgical History:  Procedure Laterality Date  . APPENDECTOMY    . CATARACT EXTRACTION, BILATERAL     Social History   Socioeconomic History  . Marital status: Married    Spouse name: Not on file  . Number of children: Not on file  . Years of education: Not on file  . Highest education level: Not on file  Occupational History  . Not on file  Tobacco Use  . Smoking status: Never Smoker  . Smokeless tobacco: Never Used  Substance and Sexual Activity  . Alcohol use: Yes    Comment: occasionally  . Drug use: No  . Sexual activity: Not on file  Other Topics Concern  . Not on file  Social History Narrative  . Not on file   Social Determinants of Health   Financial Resource Strain:   . Difficulty of Paying Living Expenses:   Food Insecurity:   . Worried About Charity fundraiser in the Last Year:   . Arboriculturist in the Last Year:   Transportation Needs:   . Film/video editor (Medical):     Marland Kitchen Lack of Transportation (Non-Medical):   Physical Activity:   . Days of Exercise per Week:   . Minutes of Exercise per Session:   Stress:   . Feeling of Stress :   Social Connections:   . Frequency of Communication with Friends and Family:   . Frequency of Social Gatherings with Friends and Family:   . Attends Religious Services:   . Active Member of Clubs or Organizations:   . Attends Archivist Meetings:   Marland Kitchen Marital Status:    No Known Allergies Family History  Problem Relation Age of Onset  . Healthy Mother   . Healthy Father   . Prostate cancer Neg Hx   . Kidney failure Neg Hx          Current Outpatient Medications (Other):  .  gabapentin (NEURONTIN) 100 MG capsule, Take 2 capsules (200 mg total) by mouth at bedtime.   Reviewed prior external information including notes and imaging from  primary care provider As well as notes that were available from care everywhere and other healthcare systems.  Past medical history, social, surgical and family history all reviewed in electronic medical record.  No pertanent information unless stated regarding to the chief complaint.   Review of Systems:  No headache, visual changes, nausea, vomiting, diarrhea, constipation, dizziness, abdominal  pain, skin rash, fevers, chills, night sweats, weight loss, swollen lymph nodes, body aches, joint swelling, chest pain, shortness of breath, mood changes. POSITIVE muscle aches  Objective  Blood pressure 124/80, pulse 76, height 6\' 4"  (1.93 m), weight 247 lb (112 kg), SpO2 97 %.   General: No apparent distress alert and oriented x3 mood and affect normal, dressed appropriately.  HEENT: Pupils equal, extraocular movements intact  Respiratory: Patient's speak in full sentences and does not appear short of breath  Cardiovascular: No lower extremity edema, non tender, no erythema  Neuro: Cranial nerves II through XII are intact, neurovascularly intact in all extremities with 2+  DTRs and 2+ pulses.  Gait normal with good balance and coordination.  MSK:  tender with full range of motion and good stability and symmetric strength and tone of shoulders, elbows, wrist, hip, knee and ankles bilaterally.  Neck exam does have some mild loss of lordosis.  Tender to palpation in the paraspinal musculature of the lumbar spine.  Patient does have some mild tenderness in the cervical spine as well.  Negative Spurling's maneuver though.  5 out of 5 strength of the upper extremities.  Osteopathic findings C2 flexed rotated and side bent right C4 flexed rotated and side bent left C6 flexed rotated and side bent left T3 extended rotated and side bent right inhaled third rib    Impression and Recommendations:     This case required medical decision making of moderate complexity. The above documentation has been reviewed and is accurate and complete , DO       Note: This dictation was prepared with Dragon dictation along with smaller phrase technology. Any transcriptional errors that result from this process are unintentional.

## 2019-09-22 NOTE — Patient Instructions (Addendum)
Good to see you Doing fantastic See me again in 6-8 weeks

## 2019-09-22 NOTE — Assessment & Plan Note (Signed)
Patient is doing significantly better at this time.  Has made significant progress.  Gabapentin we discussed prescription management.  Continues to respond well to osteopathic manipulation.  Follow-up again in 4 to 8 weeks.

## 2019-11-11 ENCOUNTER — Other Ambulatory Visit: Payer: Self-pay

## 2019-11-11 ENCOUNTER — Encounter: Payer: Self-pay | Admitting: Family Medicine

## 2019-11-11 ENCOUNTER — Ambulatory Visit: Payer: BC Managed Care – PPO | Admitting: Family Medicine

## 2019-11-11 DIAGNOSIS — M999 Biomechanical lesion, unspecified: Secondary | ICD-10-CM | POA: Diagnosis not present

## 2019-11-11 DIAGNOSIS — M5412 Radiculopathy, cervical region: Secondary | ICD-10-CM | POA: Diagnosis not present

## 2019-11-11 NOTE — Assessment & Plan Note (Signed)
   Decision today to treat with OMT was based on Physical Exam  After verbal consent patient was treated with HVLA, ME, FPR techniques in cervical, thoracic, rib,areas, all areas are chronic   Patient tolerated the procedure well with improvement in symptoms  Patient given exercises, stretches and lifestyle modifications  See medications in patient instructions if given  Patient will follow up in 4-8 weeks 

## 2019-11-11 NOTE — Assessment & Plan Note (Signed)
Radicular symptoms.  Seems to have improved.  Seems to be good at the moment.  Not taking the gabapentin regularly.  Patient is doing well with conservative therapy and we will see patient again in 7 to 8 weeks.

## 2019-11-11 NOTE — Patient Instructions (Signed)
Keep it up See me again in 7 weeks

## 2019-11-11 NOTE — Progress Notes (Signed)
[name] [address] [place] [address] Phone: [phone] Subjective:   [name], am serving as a scribe for Dr. Antoine Primas. This visit occurred during the SARS-CoV-2 public health emergency.  Safety protocols were in place, including screening questions prior to the visit, additional usage of staff PPE, and extensive cleaning of exam room while observing appropriate contact time as indicated for disinfecting solutions.   I'm seeing this patient by the request  of:  Crissman, Redge Gainer, MD  CC: Neck pain follow-up  [id number]  Andrew [name] is a 39 y.o. male coming in with complaint of back pain. Last seen on 09/22/2019 for OMT. Patient states that he has had an increase in his pain recently as it is time for his adjustment. No other issues since last visit.      No past medical history on file. Past Surgical History:  Procedure Laterality Date  . APPENDECTOMY    . CATARACT EXTRACTION, BILATERAL     Social History   Socioeconomic History  . Marital status: Married    Spouse name: Not on file  . Number of children: Not on file  . Years of education: Not on file  . Highest education level: Not on file  Occupational History  . Not on file  Tobacco Use  . Smoking status: Never Smoker  . Smokeless tobacco: Never Used  Substance and Sexual Activity  . Alcohol use: Yes    Comment: occasionally  . Drug use: No  . Sexual activity: Not on file  Other Topics Concern  . Not on file  Social History Narrative  . Not on file   Social Determinants of Health   Financial Resource Strain:   . Difficulty of Paying Living Expenses:   Food Insecurity:   . Worried About Programme researcher, broadcasting/film/video in the Last Year:   . Barista in the Last Year:   Transportation Needs:   . Freight forwarder (Medical):   Marland Kitchen Lack of Transportation (Non-Medical):   Physical Activity:   . Days of Exercise per Week:   . Minutes of Exercise per  Session:   Stress:   . Feeling of Stress :   Social Connections:   . Frequency of Communication with Friends and Family:   . Frequency of Social Gatherings with Friends and Family:   . Attends Religious Services:   . Active Member of Clubs or Organizations:   . Attends Banker Meetings:   Marland Kitchen Marital Status:    No Known Allergies Family History  Problem Relation Age of Onset  . Healthy Mother   . Healthy Father   . Prostate cancer Neg Hx   . Kidney failure Neg Hx    No current outpatient medications on file.   Reviewed prior external information including notes and imaging from  primary care provider As well as notes that were available from care everywhere and other healthcare systems.  Past medical history, social, surgical and family history all reviewed in electronic medical record.  No pertanent information unless stated regarding to the chief complaint.   Review of Systems:  No visual changes, nausea, vomiting, diarrhea, constipation, dizziness, abdominal pain, skin rash, fevers, chills, night sweats, weight loss, swollen lymph nodes, body aches, joint swelling, chest pain, shortness of breath, mood changes. POSITIVE muscle aches, headache  Objective  Blood pressure 112/66, pulse 73, height 6\' 4"  (1.93 m), weight 245 lb (111.1 kg), SpO2 98 %.  General: No apparent distress alert and oriented x3 mood and affect normal, dressed appropriately.  HEENT: Pupils equal, extraocular movements intact  Respiratory: Patient's speak in full sentences and does not appear short of breath  Cardiovascular: No lower extremity edema, non tender, no erythema  Neuro: Cranial nerves II through XII are intact, neurovascularly intact in all extremities with 2+ DTRs and 2+ pulses.  Gait normal with good balance and coordination.  MSK:  tender with full range of motion and good stability and symmetric strength and tone of shoulders, elbows, wrist, hip, knee and ankles bilaterally.    Neck exam shows some mild loss of lordosis.  Some tenderness to palpation in the paraspinal musculature lumbar spine right greater than left.  Negative Spurling's.  Tightness in the parascapular region  Osteopathic findings  C2 flexed rotated and side bent right C7 flexed rotated and side bent left T3 extended rotated and side bent right inhaled third rib T7 extended rotated and side bent left      Impression and Recommendations:     This case required medical decision making of moderate complexity. The above documentation has been reviewed and is accurate and complete Lyndal Pulley, DO       Note: This dictation was prepared with Dragon dictation along with smaller phrase technology. Any transcriptional errors that result from this process are unintentional.

## 2020-01-05 ENCOUNTER — Other Ambulatory Visit: Payer: Self-pay

## 2020-01-05 ENCOUNTER — Ambulatory Visit: Payer: BC Managed Care – PPO | Admitting: Family Medicine

## 2020-01-05 ENCOUNTER — Encounter: Payer: Self-pay | Admitting: Family Medicine

## 2020-01-05 VITALS — BP 122/88 | HR 71 | Ht 76.0 in | Wt 245.0 lb

## 2020-01-05 DIAGNOSIS — M5412 Radiculopathy, cervical region: Secondary | ICD-10-CM | POA: Diagnosis not present

## 2020-01-05 DIAGNOSIS — M999 Biomechanical lesion, unspecified: Secondary | ICD-10-CM

## 2020-01-05 NOTE — Progress Notes (Signed)
[name] [address] [place] [address] Phone: [phone] Subjective:   Andrew [name], am serving as a scribe for Dr. Antoine Primas. This visit occurred during the SARS-CoV-2 public health emergency.  Safety protocols were in place, including screening questions prior to the visit, additional usage of staff PPE, and extensive cleaning of exam room while observing appropriate contact time as indicated for disinfecting solutions.   I'm seeing this patient by the request  of:  Steele Sizer, MD  CC: Follow-up  [id number]   11/11/2019 Radicular symptoms.  Seems to have improved.  Seems to be good at the moment.  Not taking the gabapentin regularly.  Patient is doing well with conservative therapy and we will see patient again in 7 to 8 weeks.  Update 01/05/2020 Andrew [name] is a 39 y.o. male coming in with complaint of neck pain. Patient states  Overall doing unremarkable.  No significant pain at the moment.  Not taking any medications regularly.     No past medical history on file. Past Surgical History:  Procedure Laterality Date   APPENDECTOMY     CATARACT EXTRACTION, BILATERAL     Social History   Socioeconomic History   Marital status: Married    Spouse name: Not on file   Number of children: Not on file   Years of education: Not on file   Highest education level: Not on file  Occupational History   Not on file  Tobacco Use   Smoking status: Never Smoker   Smokeless tobacco: Never Used  Vaping Use   Vaping Use: Never used  Substance and Sexual Activity   Alcohol use: Yes    Comment: occasionally   Drug use: No   Sexual activity: Not on file  Other Topics Concern   Not on file  Social History Narrative   Not on file   Social Determinants of Health   Financial Resource Strain:    Difficulty of Paying Living Expenses:   Food Insecurity:    Worried About Programme researcher, broadcasting/film/video in the Last  Year:    Barista in the Last Year:   Transportation Needs:    Freight forwarder (Medical):    Lack of Transportation (Non-Medical):   Physical Activity:    Days of Exercise per Week:    Minutes of Exercise per Session:   Stress:    Feeling of Stress :   Social Connections:    Frequency of Communication with Friends and Family:    Frequency of Social Gatherings with Friends and Family:    Attends Religious Services:    Active Member of Clubs or Organizations:    Attends Engineer, structural:    Marital Status:    No Known Allergies Family History  Problem Relation Age of Onset   Healthy Mother    Healthy Father    Prostate cancer Neg Hx    Kidney failure Neg Hx    No current outpatient medications on file.   Reviewed prior external information including notes and imaging from  primary care provider As well as notes that were available from care everywhere and other healthcare systems.  Past medical history, social, surgical and family history all reviewed in electronic medical record.  No pertanent information unless stated regarding to the chief complaint.   Review of Systems:  No headache, visual changes, nausea, vomiting, diarrhea, constipation, dizziness, abdominal pain, skin rash, fevers, chills, night sweats, weight loss,  swollen lymph nodes, body aches, joint swelling, chest pain, shortness of breath, mood changes. POSITIVE muscle aches  Objective  Blood pressure 122/88, pulse 71, height 6\' 4"  (1.93 m), weight 245 lb (111.1 kg), SpO2 99 %.   General: No apparent distress alert and oriented x3 mood and affect normal, dressed appropriately.  HEENT: Pupils equal, extraocular movements intact  Respiratory: Patient's speak in full sentences and does not appear short of breath  Cardiovascular: No lower extremity edema, non tender, no erythema  Neuro: Cranial nerves II through XII are intact, neurovascularly intact in all extremities  with 2+ DTRs and 2+ pulses.  Gait normal with good balance and coordination.  MSK:  Non tender with full range of motion and good stability and symmetric strength and tone of shoulders, elbows, wrist, hip, knee and ankles bilaterally.  Neck exam mild loss of lordosis.  Full range of motion.  Negative Spurling's.  Osteopathic findings C2 flexed rotated and side bent right C4 flexed rotated and side bent left C6 flexed rotated and side bent left T5 extended rotated and side bent right inhaled rib     Impression and Recommendations:     The above documentation has been reviewed and is accurate and complete , DO       Note: This dictation was prepared with Dragon dictation along with smaller phrase technology. Any transcriptional errors that result from this process are unintentional.

## 2020-01-05 NOTE — Assessment & Plan Note (Addendum)
   Decision today to treat with OMT was based on Physical Exam  After verbal consent patient was treated with HVLA, ME, FPR techniques in cervical, thoracic, rib,  areas, all areas are chronic   Patient tolerated the procedure well with improvement in symptoms  Patient given exercises, stretches and lifestyle modifications  See medications in patient instructions if given  Patient will follow up in 12 weeks 

## 2020-01-05 NOTE — Assessment & Plan Note (Signed)
Significant decrease in cervical radiculopathy.  Doing much better at this time. Good response to osteopathic manipulation.  Follow-up again in 3 months

## 2020-01-05 NOTE — Patient Instructions (Signed)
Looking good overall keep it up See me again in 10-12 weeks

## 2020-03-11 NOTE — Progress Notes (Signed)
[name] [address] [place] [address] Phone: [phone] Subjective:   I Ronelle Nigh am serving as a Neurosurgeon for Dr. Antoine Primas.  This visit occurred during the SARS-CoV-2 public health emergency.  Safety protocols were in place, including screening questions prior to the visit, additional usage of staff PPE, and extensive cleaning of exam room while observing appropriate contact time as indicated for disinfecting solutions.   I'm seeing this patient by the request  of:  Crissman, Redge Gainer, MD  CC: Neck pain follow-up  [id number]  Andrew [name] is a 39 y.o. male coming in with complaint of back and neck pain. OMT 01/05/2020. Patient states he has been doing well.   Medications patient has been prescribed: None          Reviewed prior external information including notes and imaging from previsou exam, outside providers and external EMR if available.   As well as notes that were available from care everywhere and other healthcare systems.  Past medical history, social, surgical and family history all reviewed in electronic medical record.  No pertanent information unless stated regarding to the chief complaint.   No past medical history on file.  No Known Allergies   Review of Systems:  No headache, visual changes, nausea, vomiting, diarrhea, constipation, dizziness, abdominal pain, skin rash, fevers, chills, night sweats, weight loss, swollen lymph nodes, body aches, joint swelling, chest pain, shortness of breath, mood changes. POSITIVE muscle aches  Objective  Blood pressure (!) 120/94, pulse 72, height 6\' 4"  (1.93 m), weight 233 lb (105.7 kg), SpO2 95 %.   General: No apparent distress alert and oriented x3 mood and affect normal, dressed appropriately.  HEENT: Pupils equal, extraocular movements intact  Respiratory: Patient's speak in full sentences and does not appear short of breath  Cardiovascular: No lower  extremity edema, non tender, no erythema  Neuro: Cranial nerves II through XII are intact, neurovascularly intact in all extremities with 2+ DTRs and 2+ pulses.  Gait normal with good balance and coordination.  MSK:  Non tender with full range of motion and good stability and symmetric strength and tone of shoulders, elbows, wrist, hip, knee and ankles bilaterally.  Back -neck exam does have some loss of lordosis, some tenderness to palpation in the paraspinal musculature, patient has some mild parascapular tenderness noted.  Limited range of motion of the neck with some mild crepitus  Osteopathic findings   C5 flexed rotated and side bent left T5 extended rotated and side bent right inhaled rib        Assessment and Plan:  Right cervical radiculopathy Patient has been doing relatively well.  Seems to be stable at this time.  Not taking any medications at this time.  Responding well to manipulation.  Follow-up again in 3 months    Nonallopathic problems  Decision today to treat with OMT was based on Physical Exam  After verbal consent patient was treated with HVLA, ME, FPR techniques in cervical, rib, thoracic  areas  Patient tolerated the procedure well with improvement in symptoms  Patient given exercises, stretches and lifestyle modifications  See medications in patient instructions if given  Patient will follow up in 12 weeks      The above documentation has been reviewed and is accurate and complete , DO       Note: This dictation was prepared with Dragon dictation along with smaller phrase technology. Any transcriptional errors that result from this  process are unintentional.

## 2020-03-15 ENCOUNTER — Encounter: Payer: Self-pay | Admitting: Family Medicine

## 2020-03-15 ENCOUNTER — Ambulatory Visit: Payer: BC Managed Care – PPO | Admitting: Family Medicine

## 2020-03-15 ENCOUNTER — Other Ambulatory Visit: Payer: Self-pay

## 2020-03-15 VITALS — BP 120/94 | HR 72 | Ht 76.0 in | Wt 233.0 lb

## 2020-03-15 DIAGNOSIS — M999 Biomechanical lesion, unspecified: Secondary | ICD-10-CM | POA: Diagnosis not present

## 2020-03-15 DIAGNOSIS — M5412 Radiculopathy, cervical region: Secondary | ICD-10-CM | POA: Diagnosis not present

## 2020-03-15 NOTE — Patient Instructions (Addendum)
Good to see you Doing amazing keep it up See me again in 3-4 months

## 2020-03-15 NOTE — Assessment & Plan Note (Signed)
Patient has been doing relatively well.  Seems to be stable at this time.  Not taking any medications at this time.  Responding well to manipulation.  Follow-up again in 3 months

## 2020-06-14 ENCOUNTER — Ambulatory Visit: Payer: BC Managed Care – PPO | Admitting: Family Medicine

## 2020-06-15 NOTE — Progress Notes (Signed)
[name] [address] [place] [address] Phone: [phone] Subjective:   Andrew [name], am serving as a scribe for Dr. Antoine Primas. This visit occurred during the SARS-CoV-2 public health emergency.  Safety protocols were in place, including screening questions prior to the visit, additional usage of staff PPE, and extensive cleaning of exam room while observing appropriate contact time as indicated for disinfecting solutions.   I'm seeing this patient by the request  of:  Crissman, Redge Gainer, MD  CC: Neck pain follow-up  [id number]  Andrew [name] is a 39 y.o. male coming in with complaint of back and neck pain. OMT 03/15/2020. Patient states overall has been doing really well.  Very minimal discomfort overall.  Patient has not been seen for 3 to 4 months.  Nothing that stopping and just noticing more tightness.  Not taking any medications at this time         Reviewed prior external information including notes and imaging from previsou exam, outside providers and external EMR if available.   As well as notes that were available from care everywhere and other healthcare systems.  Past medical history, social, surgical and family history all reviewed in electronic medical record.  No pertanent information unless stated regarding to the chief complaint.   No past medical history on file.  No Known Allergies   Review of Systems:  No headache, visual changes, nausea, vomiting, diarrhea, constipation, dizziness, abdominal pain, skin rash, fevers, chills, night sweats, weight loss, swollen lymph nodes, body aches, joint swelling, chest pain, shortness of breath, mood changes. POSITIVE muscle aches  Objective  Blood pressure 120/84, pulse 81, height 6\' 4"  (1.93 m), weight 232 lb (105.2 kg), SpO2 98 %.   General: No apparent distress alert and oriented x3 mood and affect normal, dressed appropriately.  HEENT: Pupils equal, extraocular  movements intact  Respiratory: Patient's speak in full sentences and does not appear short of breath  Cardiovascular: No lower extremity edema, non tender, no erythema  Neuro: Cranial nerves II through XII are intact, neurovascularly intact in all extremities with 2+ DTRs and 2+ pulses.  Gait normal with good balance and coordination.  MSK:  Non tender with full range of motion and good stability and symmetric strength and tone of shoulders, elbows, wrist, hip, knee and ankles bilaterally.  Neck exam very mild loss of lordosis.  Patient lacks the last 5 degrees of extension.  Negative Spurling's though.  Osteopathic findings   C6 flexed rotated and side bent left T4 extended rotated and side bent right inhaled rib        Assessment and Plan:  Right cervical radiculopathy Patient likely is not having any true radicular symptoms.  Is doing very well.  We will space out to 18-month intervals.  No change in management otherwise    Nonallopathic problems  Decision today to treat with OMT was based on Physical Exam  After verbal consent patient was treated with HVLA, ME, FPR techniques in cervical, rib, thoracic areas  Patient tolerated the procedure well with improvement in symptoms  Patient given exercises, stretches and lifestyle modifications  See medications in patient instructions if given  Patient will follow up in 4-8 weeks      The above documentation has been reviewed and is accurate and complete 6-month, DO       Note: This dictation was prepared with Dragon dictation along with smaller phrase technology. Any transcriptional errors that result from  this process are unintentional.

## 2020-06-16 ENCOUNTER — Other Ambulatory Visit: Payer: Self-pay

## 2020-06-16 ENCOUNTER — Encounter: Payer: Self-pay | Admitting: Family Medicine

## 2020-06-16 ENCOUNTER — Ambulatory Visit: Payer: BC Managed Care – PPO | Admitting: Family Medicine

## 2020-06-16 VITALS — BP 120/84 | HR 81 | Ht 76.0 in | Wt 232.0 lb

## 2020-06-16 DIAGNOSIS — M5412 Radiculopathy, cervical region: Secondary | ICD-10-CM | POA: Diagnosis not present

## 2020-06-16 DIAGNOSIS — M999 Biomechanical lesion, unspecified: Secondary | ICD-10-CM | POA: Diagnosis not present

## 2020-06-16 NOTE — Patient Instructions (Signed)
Glad you are doing well Drive safe Continue exercises See me again in 3-4 months

## 2020-06-16 NOTE — Assessment & Plan Note (Signed)
Patient likely is not having any true radicular symptoms.  Is doing very well.  We will space out to 33-month intervals.  No change in management otherwise

## 2020-09-21 NOTE — Progress Notes (Signed)
[name] [address] [place] [address] Phone: [phone] Subjective:   Andrew [name], am serving as a scribe for Dr. Antoine Primas. This visit occurred during the SARS-CoV-2 public health emergency.  Safety protocols were in place, including screening questions prior to the visit, additional usage of staff PPE, and extensive cleaning of exam room while observing appropriate contact time as indicated for disinfecting solutions.   I'm seeing this patient by the request  of:  Vigg, Avanti, MD  CC: Neck pain follow-up  [id number]  Andrew [name] is a 40 y.o. male coming in with complaint of back and neck pain. OMT 06/16/2020. Patient states that he has not had any new issues since last visit.  Patient has had no radicular symptoms.  Doing well.  Not taking anything for pain at this time.  Medications patient has been prescribed: None         Reviewed prior external information including notes and imaging from previsou exam, outside providers and external EMR if available.   As well as notes that were available from care everywhere and other healthcare systems.  Past medical history, social, surgical and family history all reviewed in electronic medical record.  No pertanent information unless stated regarding to the chief complaint.   No past medical history on file.  No Known Allergies   Review of Systems:  No headache, visual changes, nausea, vomiting, diarrhea, constipation, dizziness, abdominal pain, skin rash, fevers, chills, night sweats, weight loss, swollen lymph nodes, body aches, joint swelling, chest pain, shortness of breath, mood changes.   Objective  Blood pressure 110/72, pulse 77, height 6\' 4"  (1.93 m), weight 241 lb (109.3 kg), SpO2 98 %.   General: No apparent distress alert and oriented x3 mood and affect normal, dressed appropriately.  HEENT: Pupils equal, extraocular movements intact  Respiratory:  Patient's speak in full sentences and does not appear short of breath  Cardiovascular: No lower extremity edema, non tender, no erythema  Neuro: Cranial nerves II through XII are intact, neurovascularly intact in all extremities with 2+ DTRs and 2+ pulses.  Gait normal with good balance and coordination.  MSK:  Non tender with full range of motion and good stability and symmetric strength and tone of shoulders, elbows, wrist, hip, knee and ankles bilaterally.  Back -neck exam very mild loss of lordosis.  Mild tightness noted on the left side of the parascapular region.  Nothing severe.  Full range of motion.  Negative Spurling's.  5-5 strength of the upper extremities  Osteopathic findings  C6 flexed rotated and side bent left T9 extended rotated and side bent left inhaled rib        Assessment and Plan:  Right cervical radiculopathy Patient is having no significant radicular symptoms.  Responded extremely well to osteopathic manipulation.  Patient at this point is doing very well with no medications.  Patient can follow-up with me again in 4 to 6 months and then probably on an as-needed basis.   Nonallopathic problems  Decision today to treat with OMT was based on Physical Exam  After verbal consent patient was treated with HVLA, ME, FPR techniques in cervical, rib, thoracic areas  Patient tolerated the procedure well with improvement in symptoms  Patient given exercises, stretches and lifestyle modifications  See medications in patient instructions if given  Patient will follow up in 4-8 weeks      The above documentation has been reviewed and is accurate and complete  Koren Bound, DO       Note: This dictation was prepared with Dragon dictation along with smaller phrase technology. Any transcriptional errors that result from this process are unintentional.

## 2020-09-22 ENCOUNTER — Encounter: Payer: Self-pay | Admitting: Family Medicine

## 2020-09-22 ENCOUNTER — Other Ambulatory Visit: Payer: Self-pay

## 2020-09-22 ENCOUNTER — Ambulatory Visit: Payer: BC Managed Care – PPO | Admitting: Family Medicine

## 2020-09-22 VITALS — BP 110/72 | HR 77 | Ht 76.0 in | Wt 241.0 lb

## 2020-09-22 DIAGNOSIS — M999 Biomechanical lesion, unspecified: Secondary | ICD-10-CM

## 2020-09-22 DIAGNOSIS — M5412 Radiculopathy, cervical region: Secondary | ICD-10-CM | POA: Diagnosis not present

## 2020-09-22 NOTE — Assessment & Plan Note (Signed)
Patient is having no significant radicular symptoms.  Responded extremely well to osteopathic manipulation.  Patient at this point is doing very well with no medications.  Patient can follow-up with me again in 4 to 6 months and then probably on an as-needed basis.

## 2020-09-22 NOTE — Patient Instructions (Signed)
Good to see you Have fun at the final 4 See me again in 4-6 months

## 2021-01-18 NOTE — Progress Notes (Signed)
[name] [address] [place] [address] Phone: [phone] Subjective:   [name], am serving as a scribe for Dr. Antoine Primas. This visit occurred during the SARS-CoV-2 public health emergency.  Safety protocols were in place, including screening questions prior to the visit, additional usage of staff PPE, and extensive cleaning of exam room while observing appropriate contact time as indicated for disinfecting solutions.   I'm seeing this patient by the request  of:  Vigg, Avanti, MD  CC: Back and neck pain follow-up  [id number]  Andrew [name] is a 40 y.o. male coming in with complaint of back and neck pain. OMT 09/22/2020. Patient states that his neck is stiff from traveling but otherwise he has been doing fine.   Medications patient has been prescribed: None           Reviewed prior external information including notes and imaging from previsou exam, outside providers and external EMR if available.   As well as notes that were available from care everywhere and other healthcare systems.  Past medical history, social, surgical and family history all reviewed in electronic medical record.  No pertanent information unless stated regarding to the chief complaint.    Review of Systems:  No headache, visual changes, nausea, vomiting, diarrhea, constipation, dizziness, abdominal pain, skin rash, fevers, chills, night sweats, weight loss, swollen lymph nodes, body aches, joint swelling, chest pain, shortness of breath, mood changes. POSITIVE muscle aches  Objective  Blood pressure 122/86, pulse 92, height 6\' 4"  (1.93 m), weight 248 lb (112.5 kg), SpO2 97 %.   General: No apparent distress alert and oriented x3 mood and affect normal, dressed appropriately.  HEENT: Pupils equal, extraocular movements intact  Respiratory: Patient's speak in full sentences and does not appear short of breath  Cardiovascular: No lower  extremity edema, non tender, no erythema  Gait normal with good balance and coordination.  MSK:  Non tender with full range of motion and good stability and symmetric strength and tone of shoulders, elbows, wrist, hip, knee and ankles bilaterally.  Neck exam does have some mild loss of lordosis.  Negative Spurling's.  Significant tightness still in the parascapular region right greater than left.  Patient has some pain also in the thoracolumbar juncture.  Osteopathic findings  C2 flexed rotated and side bent right C6 flexed rotated and side bent left T3 extended rotated and side bent right inhaled rib T 11 extended rotated and side bent right       Assessment and Plan:  Right cervical radiculopathy Patient likely not having significant cervical radiculopathy.  Some mild increase in upper thoracic pain on previous exam.  No radiation.  Responded extremely well to osteopathic manipulation and not taking any other medications.  Follow-up with me again in 3 to 4 months   Nonallopathic problems  Decision today to treat with OMT was based on Physical Exam  After verbal consent patient was treated with HVLA, ME, FPR techniques in cervical, rib, thoracic, areas  Patient tolerated the procedure well with improvement in symptoms  Patient given exercises, stretches and lifestyle modifications  See medications in patient instructions if given  Patient will follow up in 4-8 weeks      The above documentation has been reviewed and is accurate and complete , DO        Note: This dictation was prepared with Dragon dictation along with smaller phrase technology. Any transcriptional errors that result from this process  are unintentional.

## 2021-01-19 ENCOUNTER — Other Ambulatory Visit: Payer: Self-pay

## 2021-01-19 ENCOUNTER — Ambulatory Visit: Payer: BC Managed Care – PPO | Admitting: Family Medicine

## 2021-01-19 ENCOUNTER — Encounter: Payer: Self-pay | Admitting: Family Medicine

## 2021-01-19 VITALS — BP 122/86 | HR 92 | Ht 76.0 in | Wt 248.0 lb

## 2021-01-19 DIAGNOSIS — M5412 Radiculopathy, cervical region: Secondary | ICD-10-CM | POA: Diagnosis not present

## 2021-01-19 DIAGNOSIS — M9908 Segmental and somatic dysfunction of rib cage: Secondary | ICD-10-CM

## 2021-01-19 DIAGNOSIS — M9902 Segmental and somatic dysfunction of thoracic region: Secondary | ICD-10-CM | POA: Diagnosis not present

## 2021-01-19 DIAGNOSIS — M9901 Segmental and somatic dysfunction of cervical region: Secondary | ICD-10-CM | POA: Diagnosis not present

## 2021-01-19 NOTE — Patient Instructions (Signed)
See me again in 3-4 months

## 2021-01-19 NOTE — Assessment & Plan Note (Signed)
Patient likely not having significant cervical radiculopathy.  Some mild increase in upper thoracic pain on previous exam.  No radiation.  Responded extremely well to osteopathic manipulation and not taking any other medications.  Follow-up with me again in 3 to 4 months

## 2021-05-17 ENCOUNTER — Ambulatory Visit: Payer: BC Managed Care – PPO | Admitting: Family Medicine

## 2021-06-15 NOTE — Progress Notes (Signed)
°  [name] [address] [place] [address] Phone: [phone] Subjective:   INadine Counts, am serving as a scribe for Dr. Antoine Primas. This visit occurred during the SARS-CoV-2 public health emergency.  Safety protocols were in place, including screening questions prior to the visit, additional usage of staff PPE, and extensive cleaning of exam room while observing appropriate contact time as indicated for disinfecting solutions.   I'm seeing this patient by the request  of:  Vigg, Avanti, MD  CC: Neck pain follow-up  [id number]  Andrew [name] is a 40 y.o. male coming in with complaint of back and neck pain. OMT 01/19/2021. Patient states about the same. Right shoulder. No new complaints.  Patient states that has been well since we have seen him.  Nothing severe.  Been able to be active  Medications patient has been prescribed: None        No past medical history on file.  No Known Allergies   Review of Systems:  No headache, visual changes, nausea, vomiting, diarrhea, constipation, dizziness, abdominal pain, skin rash, fevers, chills, night sweats, weight loss, swollen lymph nodes, body aches, joint swelling, chest pain, shortness of breath, mood changes. POSITIVE muscle aches  Objective  Blood pressure 118/72, pulse 84, height 6\' 4"  (1.93 m), weight 254 lb (115.2 kg), SpO2 98 %.   General: No apparent distress alert and oriented x3 mood and affect normal, dressed appropriately.  HEENT: Pupils equal, extraocular movements intact  Respiratory: Patient's speak in full sentences and does not appear short of breath  Cardiovascular: No lower extremity edema, non tender, no erythema  Neck exam very mild loss of lordosis.  Patient does have tightness of the cervical thoracic area right side.  Patient also has some tightness with some trigger points noted in the right parascapular region.  Osteopathic findings  C2 flexed rotated and  side bent right C6 flexed rotated and side bent right T3 extended rotated and side bent right inhaled rib T9 extended rotated and side bent left       Assessment and Plan:  Right cervical radiculopathy Chronic problem and stable.  Responding well. Patient is not having any radicular symptoms.  Responded extremely well to osteopathic manipulation.  Can follow-up again in 3 to 6 months   Nonallopathic problems  Decision today to treat with OMT was based on Physical Exam  After verbal consent patient was treated with HVLA, ME, FPR techniques in cervical, rib, thoracic areas  Patient tolerated the procedure well with improvement in symptoms  Patient given exercises, stretches and lifestyle modifications  See medications in patient instructions if given  Patient will follow up in 12 weeks      The above documentation has been reviewed and is accurate and complete , DO        Note: This dictation was prepared with Dragon dictation along with smaller phrase technology. Any transcriptional errors that result from this process are unintentional.

## 2021-06-16 ENCOUNTER — Other Ambulatory Visit: Payer: Self-pay

## 2021-06-16 ENCOUNTER — Ambulatory Visit (INDEPENDENT_AMBULATORY_CARE_PROVIDER_SITE_OTHER): Payer: BC Managed Care – PPO | Admitting: Family Medicine

## 2021-06-16 VITALS — BP 118/72 | HR 84 | Ht 76.0 in | Wt 254.0 lb

## 2021-06-16 DIAGNOSIS — M5412 Radiculopathy, cervical region: Secondary | ICD-10-CM | POA: Diagnosis not present

## 2021-06-16 DIAGNOSIS — M9902 Segmental and somatic dysfunction of thoracic region: Secondary | ICD-10-CM

## 2021-06-16 DIAGNOSIS — M9901 Segmental and somatic dysfunction of cervical region: Secondary | ICD-10-CM | POA: Diagnosis not present

## 2021-06-16 DIAGNOSIS — M9908 Segmental and somatic dysfunction of rib cage: Secondary | ICD-10-CM | POA: Diagnosis not present

## 2021-06-16 NOTE — Patient Instructions (Signed)
Good to see you! Good luck with the puppies See you again in 3-6 months

## 2021-06-16 NOTE — Assessment & Plan Note (Signed)
Chronic problem and stable.  Responding well. Patient is not having any radicular symptoms.  Responded extremely well to osteopathic manipulation.  Can follow-up again in 3 to 6 months

## 2021-09-24 IMAGING — DX DG SHOULDER 2+V*R*
3 series · 3 of 3 positions shown · non-contrast
Comparison: None.

CLINICAL DATA: Right shoulder pain for 1 month.  No known injury.

EXAM:
RIGHT SHOULDER - 2+ VIEW

[grashey]
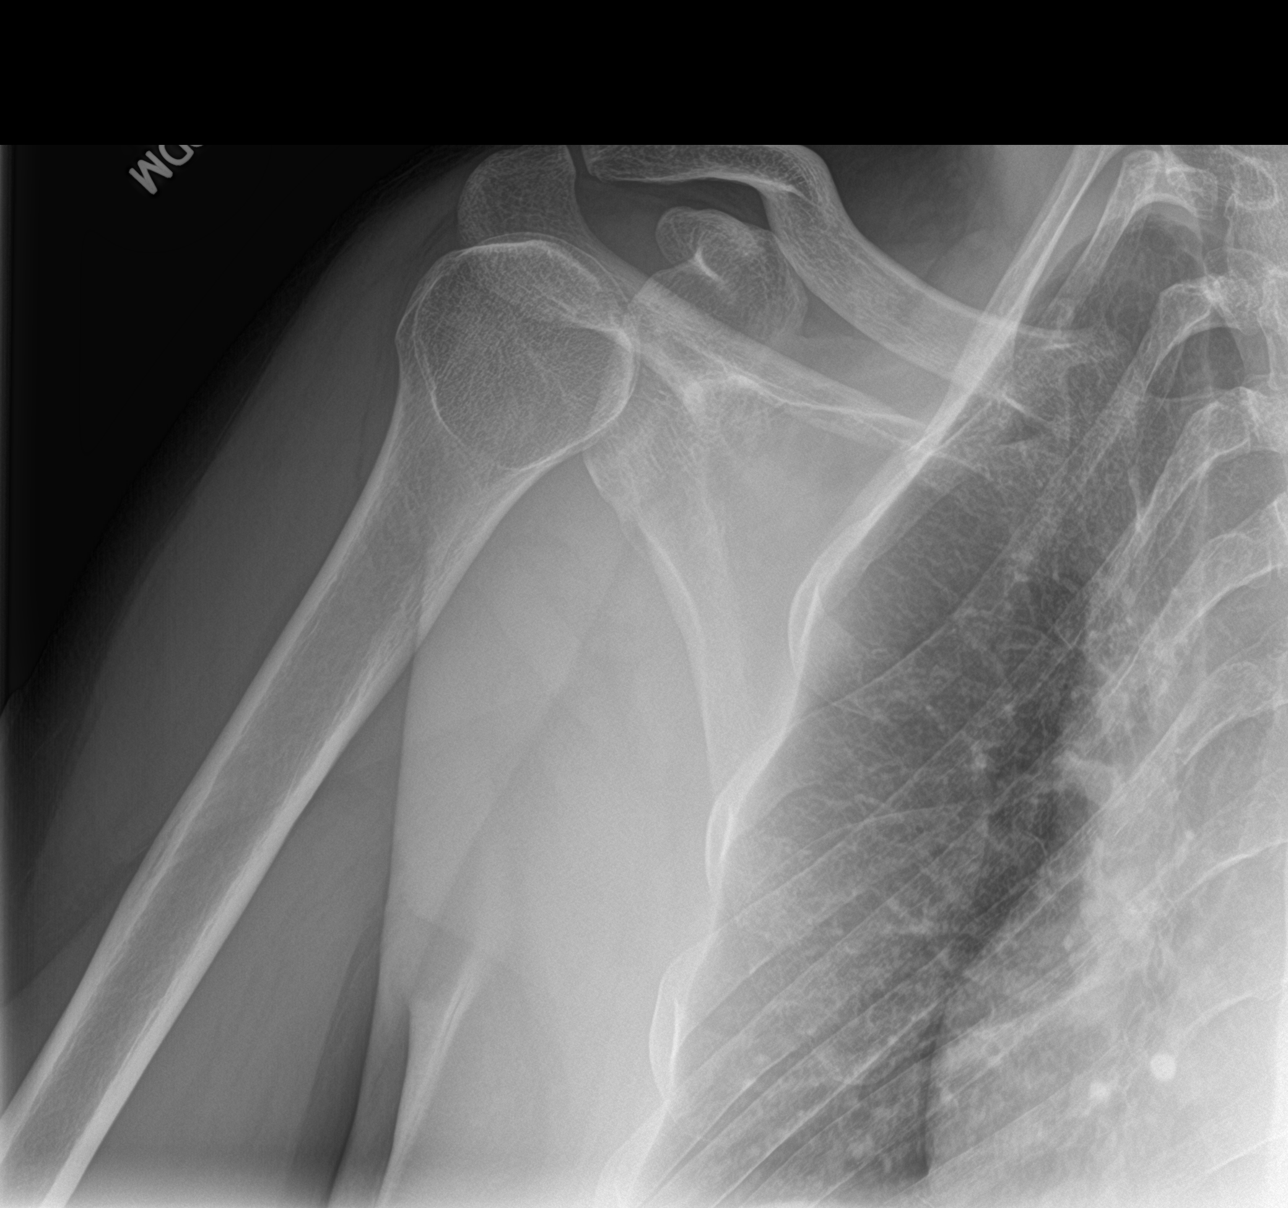

[y view]
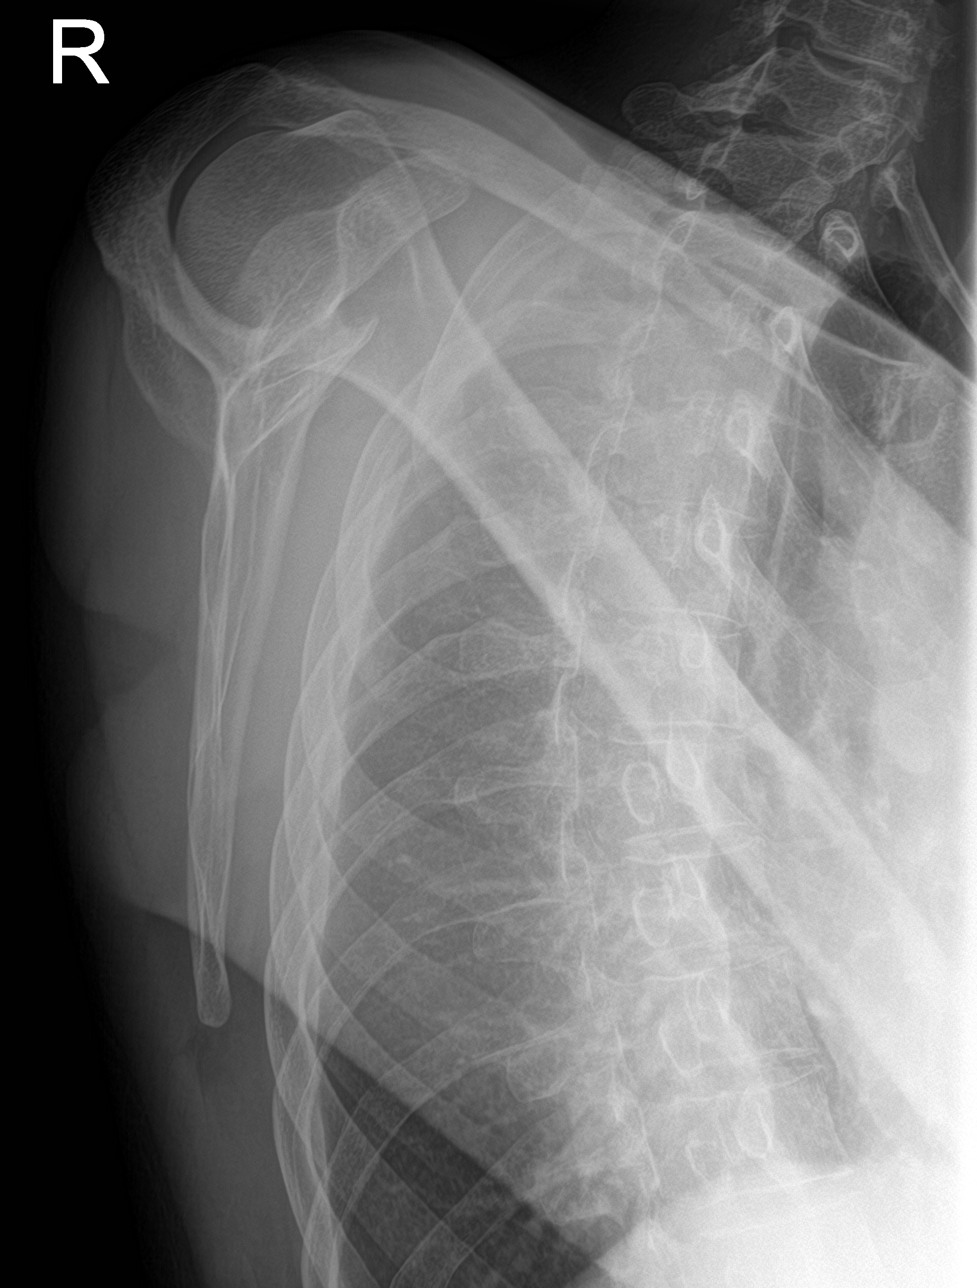

[shoulder axial]
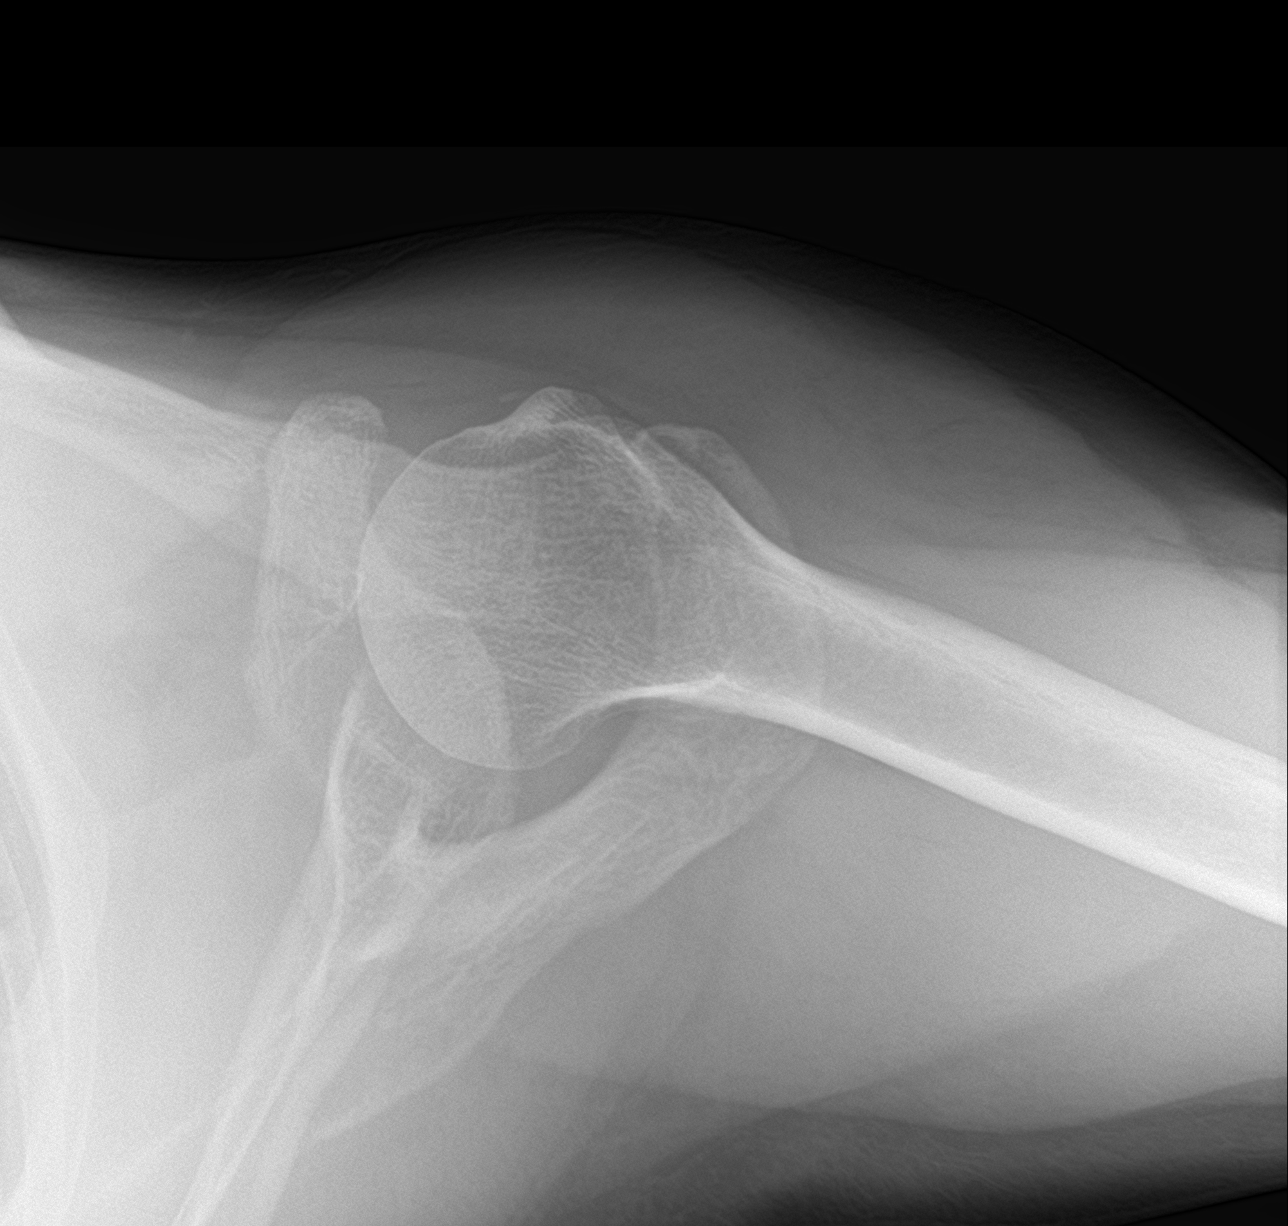

[3 of 3 positions shown; findings below may reference images not displayed]

FINDINGS: There is no evidence of fracture or dislocation. There is no
evidence of arthropathy or other focal bone abnormality. Soft
tissues are unremarkable.
IMPRESSION: Negative.

## 2021-11-02 NOTE — Progress Notes (Signed)
?  Terrilee Files D.O. ?[facility] ?[address] [place] [address] ?Phone: [phone] ?Subjective:   ?I, Nadine Counts, am serving as a Neurosurgeon for Dr. Antoine Primas. ?This visit occurred during the SARS-CoV-2 public health emergency.  Safety protocols were in place, including screening questions prior to the visit, additional usage of staff PPE, and extensive cleaning of exam room while observing appropriate contact time as indicated for disinfecting solutions.  ? ?I'm seeing this patient by the request  of:  Vigg, Avanti, MD ? ?CC: Neck pain follow-up ? ?[id number]  ?Andrew [name] is a 41 y.o. male coming in with complaint of back and neck pain. OMT 06/16/2021. Patient states doing well. Feeling better. Here for manipulation. Same per usual. No new complaints. ? ?Medications patient has been prescribed: None ? ?Taking: ? ? ?  ? ? ? ? ?Reviewed prior external information including notes and imaging from previsou exam, outside providers and external EMR if available.  ? ?As well as notes that were available from care everywhere and other healthcare systems. ? ?Past medical history, social, surgical and family history all reviewed in electronic medical record.  No pertanent information unless stated regarding to the chief complaint.  ? ?No past medical history on file.  ?No Known Allergies ? ? ?Review of Systems: ? No headache, visual changes, nausea, vomiting, diarrhea, constipation, dizziness, abdominal pain, skin rash, fevers, chills, night sweats, weight loss, swollen lymph nodes, body aches, joint swelling, chest pain, shortness of breath, mood changes. ? ?Objective  ?Blood pressure 116/76, pulse 76, height 6\' 4"  (1.93 m), weight 231 lb (104.8 kg), SpO2 96 %. ?  ?General: No apparent distress alert and oriented x3 mood and affect normal, dressed appropriately.  ?HEENT: Pupils equal, extraocular movements intact  ?Respiratory: Patient's speak in full sentences and does not appear short of  breath  ?Cardiovascular: No lower extremity edema, non tender, no erythema  ?Neck exam does show some tightness noted.  Seems to be in the paraspinal musculature.  Mild pain in the parascapular region on the right side but nothing significant. ? ?Osteopathic findings ? ?C2 flexed rotated and side bent right ?C6 flexed rotated and side bent left ?T3 extended rotated and side bent right inhaled rib ? ? ? ? ? ?  ?Assessment and Plan: ? ?No problem-specific Assessment & Plan notes found for this encounter. ?  ? ?Nonallopathic problems ? ?Decision today to treat with OMT was based on Physical Exam ? ?After verbal consent patient was treated with HVLA, ME, FPR techniques in cervical, rib, thoracic, areas ? ?Patient tolerated the procedure well with improvement in symptoms ? ?Patient given exercises, stretches and lifestyle modifications ? ?See medications in patient instructions if given ? ?Patient will follow up in 12 weeks ? ?  ?The above documentation has been reviewed and is accurate and complete , DO ? ? ? ?  ? ? Note: This dictation was prepared with Dragon dictation along with smaller phrase technology. Any transcriptional errors that result from this process are unintentional.    ?  ?  ? ?

## 2021-11-03 ENCOUNTER — Ambulatory Visit: Payer: BC Managed Care – PPO | Admitting: Family Medicine

## 2021-11-04 ENCOUNTER — Ambulatory Visit: Payer: BC Managed Care – PPO | Admitting: Family Medicine

## 2021-11-04 VITALS — BP 116/76 | HR 76 | Ht 76.0 in | Wt 231.0 lb

## 2021-11-04 DIAGNOSIS — M9902 Segmental and somatic dysfunction of thoracic region: Secondary | ICD-10-CM | POA: Diagnosis not present

## 2021-11-04 DIAGNOSIS — M9901 Segmental and somatic dysfunction of cervical region: Secondary | ICD-10-CM

## 2021-11-04 DIAGNOSIS — M9908 Segmental and somatic dysfunction of rib cage: Secondary | ICD-10-CM

## 2021-11-04 DIAGNOSIS — M5412 Radiculopathy, cervical region: Secondary | ICD-10-CM

## 2021-11-04 NOTE — Patient Instructions (Addendum)
Good to see you! ?Have a great time in [place] ?See you again in 3-4 months  ?

## 2021-11-05 NOTE — Assessment & Plan Note (Signed)
Patient has done relatively well overall.  Patient seems to be stable.  Can follow-up every 3 to 4 months.  Responding well to manipulation. ?

## 2022-02-14 ENCOUNTER — Ambulatory Visit: Payer: BC Managed Care – PPO | Admitting: Family Medicine

## 2022-03-08 ENCOUNTER — Encounter: Payer: Self-pay | Admitting: Nurse Practitioner

## 2022-03-08 ENCOUNTER — Ambulatory Visit: Payer: BC Managed Care – PPO | Admitting: Nurse Practitioner

## 2022-03-08 VITALS — BP 126/78 | HR 60 | Temp 98.1°F | Ht 76.0 in | Wt 231.0 lb

## 2022-03-08 DIAGNOSIS — R002 Palpitations: Secondary | ICD-10-CM

## 2022-03-08 DIAGNOSIS — N529 Male erectile dysfunction, unspecified: Secondary | ICD-10-CM

## 2022-03-08 DIAGNOSIS — Z114 Encounter for screening for human immunodeficiency virus [HIV]: Secondary | ICD-10-CM

## 2022-03-08 DIAGNOSIS — Z0000 Encounter for general adult medical examination without abnormal findings: Secondary | ICD-10-CM | POA: Diagnosis not present

## 2022-03-08 DIAGNOSIS — E663 Overweight: Secondary | ICD-10-CM

## 2022-03-08 DIAGNOSIS — Z1159 Encounter for screening for other viral diseases: Secondary | ICD-10-CM

## 2022-03-08 MED ORDER — SILDENAFIL CITRATE 25 MG PO TABS: 25.0000 mg | ORAL_TABLET | Freq: Every day | ORAL | 0 refills | Status: DC | PRN

## 2022-03-08 NOTE — Progress Notes (Signed)
New Patient Office Visit  Subjective    Patient ID: Andrew [name], male    DOB: 1981/06/06  Age: 41 y.o. MRN: [id number]  CC: No chief complaint on file.   HPI Andrew [name] presents to establish care  for complete physical and follow up of chronic conditions.  Immunizations: -Tetanus:2020 -Influenza:refused -Covid-19: refused -Shingles: Too young -Pneumonia: Too young  -HPV:  Diet: Fair diet. 2 meals. Snack (niuts and fruits) lost 30spounds over the past 8 montjs. Water. Exercise: States some walking. He will play sports with kids. Daily 30 mins a day   Eye exam: Completes annually.  Wears glasses last visit was 2022 oct Dental exam: Completes semi-annually   Colonoscopy: Too young, currently average risk Lung Cancer Screening: N/A Dexa: N/A  PSA: Too young, currently average risk Palpitaitons: states it has been intermitten for an extendedperiod of time. States that he feels it several times a week for a few seconds.  ED: difficultty maintaing an erection but able to finish sexual intercourse.  Sleep: states goes to bed around 930 and up at 545. Feels rested. Does snore     Outpatient Encounter Medications as of 03/08/2022  Medication Sig   sildenafil (VIAGRA) 25 MG tablet Take 1 tablet (25 mg total) by mouth daily as needed for erectile dysfunction.   No facility-administered encounter medications on file as of 03/08/2022.    Past Medical History:  Diagnosis Date   Heart murmur    Palpitation     Past Surgical History:  Procedure Laterality Date   APPENDECTOMY     CATARACT EXTRACTION, BILATERAL      Family History  Problem Relation Age of Onset   Arthritis Mother    Hypertension Father    Depression Father    Hyperlipidemia Father    Dementia Maternal Grandmother    Cancer Paternal Grandmother    Prostate cancer Neg Hx    Kidney failure Neg Hx     Social History   Socioeconomic History   Marital status: Married    Spouse name:  Shanda Bumps   Number of children: 4   Years of education: Not on file   Highest education level: Master's degree (e.g., MA, MS, MEng, MEd, MSW, MBA)  Occupational History   Not on file  Tobacco Use   Smoking status: Never   Smokeless tobacco: Former    Quit date: 06/26/2001  Vaping Use   Vaping Use: Never used  Substance and Sexual Activity   Alcohol use: Not Currently   Drug use: No   Sexual activity: Not on file  Other Topics Concern   Not on file  Social History Narrative   Fulltime: VP of workforce for [employer]   Social Determinants of Health   Financial Resource Strain: Not on file  Food Insecurity: Not on file  Transportation Needs: Not on file  Physical Activity: Not on file  Stress: Not on file  Social Connections: Not on file  Intimate Partner Violence: Not on file    Review of Systems  Constitutional:  Negative for chills and fever.  Respiratory:  Negative for shortness of breath.   Cardiovascular:  Positive for palpitations. Negative for chest pain and leg swelling.  Gastrointestinal:  Negative for abdominal pain, constipation, diarrhea, nausea and vomiting.       Bm daily   Genitourinary:  Negative for dysuria and hematuria.  Neurological:  Negative for headaches (every other day for past week).  Psychiatric/Behavioral:  Negative for hallucinations and suicidal ideas.  Objective    BP 126/78   Pulse 60   Temp 98.1 F (36.7 C) (Oral)   Ht 6\' 4"  (1.93 m)   Wt 231 lb (104.8 kg)   SpO2 98%   BMI 28.12 kg/m   Physical Exam Vitals and nursing note reviewed. Exam conducted with a chaperone present , CMA).  Constitutional:      Appearance: Normal appearance.  HENT:     Right Ear: Ear canal and external ear normal. Tympanic membrane is scarred.     Left Ear: Ear canal and external ear normal. Tympanic membrane is scarred.     Mouth/Throat:     Mouth: Mucous membranes are moist.     Pharynx: Oropharynx is clear.  Eyes:     Extraocular  Movements: Extraocular movements intact.     Pupils: Pupils are equal, round, and reactive to light.     Comments: Wears glasses  Cardiovascular:     Rate and Rhythm: Normal rate and regular rhythm.     Pulses: Normal pulses.     Heart sounds: Normal heart sounds.  Pulmonary:     Effort: Pulmonary effort is normal.     Breath sounds: Normal breath sounds.  Abdominal:     General: Bowel sounds are normal. There is no distension.     Palpations: There is no mass.     Tenderness: There is no abdominal tenderness.     Hernia: No hernia is present. There is no hernia in the left inguinal area or right inguinal area.  Genitourinary:    Penis: Normal.      Testes: Normal.     Epididymis:     Right: Normal.     Left: Normal.  Musculoskeletal:     Right lower leg: No edema.     Left lower leg: No edema.  Lymphadenopathy:     Cervical: No cervical adenopathy.     Lower Body: No right inguinal adenopathy. No left inguinal adenopathy.  Skin:    General: Skin is warm.  Neurological:     General: No focal deficit present.     Mental Status: He is alert.     Comments: Bilateral upper and lower extremity strength 5/5  Psychiatric:        Mood and Affect: Mood normal.        Behavior: Behavior normal.        Thought Content: Thought content normal.        Judgment: Judgment normal.         Assessment & Plan:   Problem List Items Addressed This Visit       Other   Palpitations    EKG within normal limits.  Will await blood work to result.  Can always send to cardiology for Holter monitor if need be.      Relevant Orders   EKG 12-Lead (Completed)   TSH   Erectile dysfunction    States difficulty with maintaining erection but able to get an erection.  We will try him on low-dose sildenafil 25 mg as needed sexual intercourse.      Relevant Medications   sildenafil (VIAGRA) 25 MG tablet   Other Relevant Orders   TSH   Overweight    Encouraged healthy lifestyle  modifications inclusive of exercise 5 times a week at 30 minutes of time.  Patient has lost approximately 30 pounds since living in this year per his report.      Relevant Orders   Lipid panel  TSH   Hemoglobin A1c   Preventative health care - Primary    Discussed age-appropriate immunizations and screening exams in office.      Relevant Orders   CBC   Comprehensive metabolic panel   TSH   Hemoglobin A1c   Other Visit Diagnoses     Need for hepatitis C screening test       Relevant Orders   Hepatitis C Antibody   Screening for HIV (human immunodeficiency virus)       Relevant Orders   HIV antibody (with reflex)       Return in about 1 year (around 03/09/2023) for CPE and labs.   Audria Nine, NP

## 2022-03-08 NOTE — Assessment & Plan Note (Signed)
States difficulty with maintaining erection but able to get an erection.  We will try him on low-dose sildenafil 25 mg as needed sexual intercourse.

## 2022-03-08 NOTE — Assessment & Plan Note (Signed)
Encouraged healthy lifestyle modifications inclusive of exercise 5 times a week at 30 minutes of time.  Patient has lost approximately 30 pounds since living in this year per his report.

## 2022-03-08 NOTE — Assessment & Plan Note (Signed)
Discussed age-appropriate immunizations and screening exams in office. 

## 2022-03-08 NOTE — Patient Instructions (Signed)
Nice to see you today EKG looks good I will be in touch with the labs once I have them Follow up with me in 1 year, sooner if you need me

## 2022-03-08 NOTE — Assessment & Plan Note (Signed)
EKG within normal limits.  Will await blood work to result.  Can always send to cardiology for Holter monitor if need be.

## 2022-03-10 ENCOUNTER — Encounter: Payer: Self-pay | Admitting: Nurse Practitioner

## 2022-03-10 NOTE — Telephone Encounter (Signed)
Left v/m per DPR for pt to have [facility] to contact [facility] to tranfer rx for pt. Also sent my chart note to pt as well.

## 2022-03-14 ENCOUNTER — Telehealth: Payer: Self-pay

## 2022-03-14 ENCOUNTER — Other Ambulatory Visit: Payer: BC Managed Care – PPO

## 2022-03-14 NOTE — Telephone Encounter (Signed)
[facility] Night - Client Nonclinical Telephone Record  [facility] Client [facility] Night - Client Client Site [place] - Night Contact Type Call Who Is Calling Patient / Member / Family / Caregiver Caller Name [place] Phone Number [phone] Patient Name Andrew [name] Patient DOB [birth year] Call Type Message Only Information Provided Reason for Call Request to Reschedule Office Appointment Initial Comment Caller states he needs to reschedule his blood draw. Patient request to speak to RN No Disp. Time Disposition Final User 03/14/2022 8:07:02 AM General Information Provided Yes Linda Hedges, Leilani Call Closed By: Tula Nakayama Buccino Transaction Date/Time: 03/14/2022 8:05:28 AM (ET

## 2022-03-17 ENCOUNTER — Other Ambulatory Visit (INDEPENDENT_AMBULATORY_CARE_PROVIDER_SITE_OTHER): Payer: BC Managed Care – PPO

## 2022-03-17 DIAGNOSIS — Z1159 Encounter for screening for other viral diseases: Secondary | ICD-10-CM

## 2022-03-17 DIAGNOSIS — N529 Male erectile dysfunction, unspecified: Secondary | ICD-10-CM | POA: Diagnosis not present

## 2022-03-17 DIAGNOSIS — E663 Overweight: Secondary | ICD-10-CM | POA: Diagnosis not present

## 2022-03-17 DIAGNOSIS — Z0000 Encounter for general adult medical examination without abnormal findings: Secondary | ICD-10-CM | POA: Diagnosis not present

## 2022-03-17 DIAGNOSIS — Z114 Encounter for screening for human immunodeficiency virus [HIV]: Secondary | ICD-10-CM

## 2022-03-17 DIAGNOSIS — R002 Palpitations: Secondary | ICD-10-CM

## 2022-03-17 LAB — COMPREHENSIVE METABOLIC PANEL
ALT: 64 U/L — ABNORMAL HIGH (ref 0–53)
AST: 37 U/L (ref 0–37)
Albumin: 4.1 g/dL (ref 3.5–5.2)
Alkaline Phosphatase: 108 U/L (ref 39–117)
BUN: 14 mg/dL (ref 6–23)
CO2: 28 mEq/L (ref 19–32)
Calcium: 9.4 mg/dL (ref 8.4–10.5)
Chloride: 104 mEq/L (ref 96–112)
Creatinine, Ser: 0.99 mg/dL (ref 0.40–1.50)
GFR: 93.44 mL/min (ref >60.00)
Glucose, Bld: 86 mg/dL (ref 70–99)
Potassium: 4.1 mEq/L (ref 3.5–5.1)
Sodium: 141 mEq/L (ref 135–145)
Total Bilirubin: 1.3 mg/dL — ABNORMAL HIGH (ref 0.2–1.2)
Total Protein: 6.8 g/dL (ref 6.0–8.3)

## 2022-03-17 LAB — LIPID PANEL
Cholesterol: 194 mg/dL (ref 0–200)
HDL: 43.8 mg/dL (ref >39.00)
LDL Cholesterol: 135 mg/dL — ABNORMAL HIGH (ref 0–99)
NonHDL: 150.48
Total CHOL/HDL Ratio: 4
Triglycerides: 79 mg/dL (ref 0.0–149.0)
VLDL: 15.8 mg/dL (ref 0.0–40.0)

## 2022-03-17 LAB — CBC
HCT: 43.6 % (ref 39.0–52.0)
Hemoglobin: 14.7 g/dL (ref 13.0–17.0)
MCHC: 33.6 g/dL (ref 30.0–36.0)
MCV: 88.3 fl (ref 78.0–100.0)
Platelets: 200 10*3/uL (ref 150.0–400.0)
RBC: 4.93 Mil/uL (ref 4.22–5.81)
RDW: 14.2 % (ref 11.5–15.5)
WBC: 3.1 10*3/uL — ABNORMAL LOW (ref 4.0–10.5)

## 2022-03-17 LAB — TSH: TSH: 2.4 u[IU]/mL (ref 0.35–5.50)

## 2022-03-17 NOTE — Addendum Note (Signed)
Addended by: Ellamae Sia on: 03/17/2022 08:18 AM   Modules accepted: Orders

## 2022-03-20 LAB — HIV ANTIBODY (ROUTINE TESTING W REFLEX): HIV 1&2 Ab, 4th Generation: NONREACTIVE

## 2022-03-20 LAB — HEMOGLOBIN A1C
Hgb A1c MFr Bld: 5.1 % of total Hgb (ref <5.7)
Mean Plasma Glucose: 100 mg/dL
eAG (mmol/L): 5.5 mmol/L

## 2022-03-20 LAB — HEPATITIS C ANTIBODY: Hepatitis C Ab: NONREACTIVE

## 2022-03-30 NOTE — Progress Notes (Signed)
  [facility] [place] [address] [place] Phone: [phone] Subjective:   IVilma Meckel, am serving as a scribe for Dr. Hulan Saas.  I'm seeing this patient by the request  of:  [name], NP  CC: Neck pain follow-up  NOI:[id number]  Desi Rowe is a 41 y.o. male coming in with complaint of back and neck pain. OMT 11/04/2021. Patient states doing well. Same per usual.  Patient was able to travel to [place] without any significant discomfort.  Feels like over the course of time and with repetitive activity starting to have more tightness again on the right side of the neck.  Not stopping him from activity and no radicular symptoms at the moment.  Medications patient has been prescribed: None  Taking:         Reviewed prior external information including notes and imaging from previsou exam, outside providers and external EMR if available.   As well as notes that were available from care everywhere and other healthcare systems.  Past medical history, social, surgical and family history all reviewed in electronic medical record.  No pertanent information unless stated regarding to the chief complaint.   Past Medical History:  Diagnosis Date   Heart murmur    Palpitation     No Known Allergies   Review of Systems:  No headache, visual changes, nausea, vomiting, diarrhea, constipation, dizziness, abdominal pain, skin rash, fevers, chills, night sweats, weight loss, swollen lymph nodes, body aches, joint swelling, chest pain, shortness of breath, mood changes. POSITIVE muscle aches  Objective  Blood pressure 124/88, pulse 79, height 6\' 4"  (1.93 m), weight 230 lb (104.3 kg), SpO2 98 %.   General: No apparent distress alert and oriented x3 mood and affect normal, dressed appropriately.  HEENT: Pupils equal, extraocular movements intact  Respiratory: Patient's speak in full sentences and does not appear short of breath   Cardiovascular: No lower extremity edema, non tender, no erythema  Neck exam does have some loss of lordosis.  Patient does have some tenderness to palpation.  Patient does have tightness with sidebending on the right side.  Negative Spurling's though.  Tightness in the right parascapular region in the trapezius area as well  Osteopathic findings  C2 flexed rotated and side bent right C7 flexed rotated and side bent right T2 extended rotated and side bent right inhaled rib T7 extended rotated and side bent right       Assessment and Plan:  Right cervical radiculopathy Likely no radicular symptoms.  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  Patient after osteopathic manipulation did have significant improvement in range of motion.    Nonallopathic problems  Decision today to treat with OMT was based on Physical Exam  After verbal consent patient was treated with HVLA, ME, FPR techniques in cervical, rib, thoracic,  areas  Patient tolerated the procedure well with improvement in symptoms  Patient given exercises, stretches and lifestyle modifications  See medications in patient instructions if given  Patient will follow up in 4-8 weeks     The above documentation has been reviewed and is accurate and complete Lyndal Pulley, DO         Note: This dictation was prepared with Dragon dictation along with smaller phrase technology. Any transcriptional errors that result from this process are unintentional.

## 2022-03-31 ENCOUNTER — Ambulatory Visit: Payer: BC Managed Care – PPO | Admitting: Family Medicine

## 2022-03-31 VITALS — BP 124/88 | HR 79 | Ht 76.0 in | Wt 230.0 lb

## 2022-03-31 DIAGNOSIS — M9901 Segmental and somatic dysfunction of cervical region: Secondary | ICD-10-CM

## 2022-03-31 DIAGNOSIS — M5412 Radiculopathy, cervical region: Secondary | ICD-10-CM

## 2022-03-31 DIAGNOSIS — M9902 Segmental and somatic dysfunction of thoracic region: Secondary | ICD-10-CM

## 2022-03-31 DIAGNOSIS — M9908 Segmental and somatic dysfunction of rib cage: Secondary | ICD-10-CM | POA: Diagnosis not present

## 2022-03-31 NOTE — Assessment & Plan Note (Addendum)
Likely no radicular symptoms.  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  Patient after osteopathic manipulation did have significant improvement in range of motion.

## 2022-03-31 NOTE — Patient Instructions (Addendum)
Good to see you! For your road trip check out  [place], [employer], [employer]

## 2022-06-06 NOTE — Progress Notes (Signed)
  [name] [address] [place] [address] Phone: [phone] Subjective:   [name], am serving as a scribe for Dr. Antoine Primas.  I'm seeing this patient by the request  of:  [name], NP  CC: neck and back pain   [id number]  Andrew [name] is a 41 y.o. male coming in with complaint of back and neck pain. OMT 03/31/2022. Patient states that he is the same as last visit.  Patient is still having some discomfort but nothing that stops him from activity.  Medications patient has been prescribed: None  Taking:         Reviewed prior external information including notes and imaging from previsou exam, outside providers and external EMR if available.   As well as notes that were available from care everywhere and other healthcare systems.  Past medical history, social, surgical and family history all reviewed in electronic medical record.  No pertanent information unless stated regarding to the chief complaint.   Past Medical History:  Diagnosis Date   Heart murmur    Palpitation     No Known Allergies   Review of Systems:  No headache, visual changes, nausea, vomiting, diarrhea, constipation, dizziness, abdominal pain, skin rash, fevers, chills, night sweats, weight loss, swollen lymph nodes, body aches, joint swelling, chest pain, shortness of breath, mood changes. POSITIVE muscle aches  Objective  Blood pressure 130/82, pulse 96, height 6\' 4"  (1.93 m), weight 235 lb (106.6 kg), SpO2 97 %.   General: No apparent distress alert and oriented x3 mood and affect normal, dressed appropriately.  HEENT: Pupils equal, extraocular movements intact  Respiratory: Patient's speak in full sentences and does not appear short of breath  Cardiovascular: No lower extremity edema, non tender, no erythema    Osteopathic findings  C2 flexed rotated and side bent right C6 flexed rotated and side bent left T3 extended rotated  and side bent right inhaled rib T9 extended rotated and side bent left    Assessment and Plan:  Right cervical radiculopathy Likely still no significant radicular symptoms.  Doing well overall with some just mild discomfort.  Does feel that the osteopathic manipulation is the most beneficial and after evaluation decided to give osteopathic manipulation again today.  Discussed posture and ergonomics.  Discussed which activities to do and which ones to avoid.  Follow-up again in 6 to 8 weeks    Nonallopathic problems  Decision today to treat with OMT was based on Physical Exam  After verbal consent patient was treated with HVLA, ME, FPR techniques in cervical, rib, thoracic areas  Patient tolerated the procedure well with improvement in symptoms  Patient given exercises, stretches and lifestyle modifications  See medications in patient instructions if given  Patient will follow up in 4-8 weeks     The above documentation has been reviewed and is accurate and complete , DO         Note: This dictation was prepared with Dragon dictation along with smaller phrase technology. Any transcriptional errors that result from this process are unintentional.

## 2022-06-09 ENCOUNTER — Ambulatory Visit: Payer: BC Managed Care – PPO | Admitting: Family Medicine

## 2022-06-09 VITALS — BP 130/82 | HR 96 | Ht 76.0 in | Wt 235.0 lb

## 2022-06-09 DIAGNOSIS — M5412 Radiculopathy, cervical region: Secondary | ICD-10-CM | POA: Diagnosis not present

## 2022-06-09 DIAGNOSIS — M9901 Segmental and somatic dysfunction of cervical region: Secondary | ICD-10-CM

## 2022-06-09 DIAGNOSIS — M9908 Segmental and somatic dysfunction of rib cage: Secondary | ICD-10-CM | POA: Diagnosis not present

## 2022-06-09 DIAGNOSIS — M9902 Segmental and somatic dysfunction of thoracic region: Secondary | ICD-10-CM

## 2022-06-09 NOTE — Assessment & Plan Note (Signed)
Likely still no significant radicular symptoms.  Doing well overall with some just mild discomfort.  Does feel that the osteopathic manipulation is the most beneficial and after evaluation decided to give osteopathic manipulation again today.  Discussed posture and ergonomics.  Discussed which activities to do and which ones to avoid.  Follow-up again in 6 to 8 weeks

## 2022-06-09 NOTE — Patient Instructions (Signed)
Good to see you See me in 8-10 weeks

## 2022-06-13 ENCOUNTER — Other Ambulatory Visit: Payer: Self-pay | Admitting: Nurse Practitioner

## 2022-06-13 DIAGNOSIS — R7989 Other specified abnormal findings of blood chemistry: Secondary | ICD-10-CM

## 2022-06-13 DIAGNOSIS — R748 Abnormal levels of other serum enzymes: Secondary | ICD-10-CM

## 2022-06-23 ENCOUNTER — Other Ambulatory Visit: Payer: Self-pay | Admitting: Nurse Practitioner

## 2022-06-23 DIAGNOSIS — N529 Male erectile dysfunction, unspecified: Secondary | ICD-10-CM

## 2022-06-27 MED ORDER — SILDENAFIL CITRATE 25 MG PO TABS: 25.0000 mg | ORAL_TABLET | Freq: Every day | ORAL | 0 refills | Status: DC | PRN

## 2022-06-30 ENCOUNTER — Other Ambulatory Visit (INDEPENDENT_AMBULATORY_CARE_PROVIDER_SITE_OTHER): Payer: BC Managed Care – PPO

## 2022-06-30 DIAGNOSIS — R748 Abnormal levels of other serum enzymes: Secondary | ICD-10-CM | POA: Diagnosis not present

## 2022-06-30 DIAGNOSIS — R7989 Other specified abnormal findings of blood chemistry: Secondary | ICD-10-CM

## 2022-06-30 LAB — CBC WITH DIFFERENTIAL/PLATELET
Basophils Absolute: 0 10*3/uL (ref 0.0–0.1)
Basophils Relative: 1 % (ref 0.0–3.0)
Eosinophils Absolute: 0.2 10*3/uL (ref 0.0–0.7)
Eosinophils Relative: 3.2 % (ref 0.0–5.0)
HCT: 45.3 % (ref 39.0–52.0)
Hemoglobin: 15.3 g/dL (ref 13.0–17.0)
Lymphocytes Relative: 34.2 % (ref 12.0–46.0)
Lymphs Abs: 1.6 10*3/uL (ref 0.7–4.0)
MCHC: 33.7 g/dL (ref 30.0–36.0)
MCV: 87.8 fl (ref 78.0–100.0)
Monocytes Absolute: 0.5 10*3/uL (ref 0.1–1.0)
Monocytes Relative: 10.8 % (ref 3.0–12.0)
Neutro Abs: 2.4 10*3/uL (ref 1.4–7.7)
Neutrophils Relative %: 50.8 % (ref 43.0–77.0)
Platelets: 215 10*3/uL (ref 150.0–400.0)
RBC: 5.16 Mil/uL (ref 4.22–5.81)
RDW: 13.2 % (ref 11.5–15.5)
WBC: 4.8 10*3/uL (ref 4.0–10.5)

## 2022-06-30 LAB — HEPATIC FUNCTION PANEL
ALT: 25 U/L (ref 0–53)
AST: 18 U/L (ref 0–37)
Albumin: 4.6 g/dL (ref 3.5–5.2)
Alkaline Phosphatase: 84 U/L (ref 39–117)
Bilirubin, Direct: 0.1 mg/dL (ref 0.0–0.3)
Total Bilirubin: 0.4 mg/dL (ref 0.2–1.2)
Total Protein: 7.2 g/dL (ref 6.0–8.3)

## 2022-08-16 NOTE — Progress Notes (Signed)
  Andrew Andrew [name] Phone: [phone] Subjective:   Andrew Andrew [name], am serving as a scribe for Dr. Hulan Saas.  I'm seeing this patient by the request  of:  [name], NP  CC: Neck and back pain follow-up  [id number]  Andrew Andrew [name] is a 42 y.o. male coming in with complaint of back and neck pain. OMT 06/09/2022. Patient states overall doing relatively well.  Continuing to have some neck and difficulty with back pain.  Medications patient has been prescribed: None  Taking:         Reviewed prior external information including notes and imaging from previsou exam, outside providers and external EMR if available.   As well as notes that were available from care everywhere and other healthcare systems.  Past medical history, social, surgical and family history all reviewed in electronic medical record.  Andrew [name] pertanent information unless stated regarding to the chief complaint.   Past Medical History:  Diagnosis Date   Heart murmur    Palpitation     Andrew [name] Known Allergies   Review of Systems:  Andrew [name] headache, visual changes, nausea, vomiting, diarrhea, constipation, dizziness, abdominal pain, skin rash, fevers, chills, night sweats, weight loss, swollen lymph nodes, body aches, joint swelling, chest pain, shortness of breath, mood changes. POSITIVE muscle aches  Objective  Blood pressure 122/84, pulse 87, height 6' 4"$  (1.93 m), weight 235 lb (106.6 kg), SpO2 97 %.   General: Andrew [name] apparent distress alert and oriented x3 mood and affect normal, dressed appropriately.  HEENT: Pupils equal, extraocular movements intact  Respiratory: Patient's speak in full sentences and does not appear short of breath  Cardiovascular: Andrew [name] lower extremity edema, non tender, Andrew [name] erythema  Gait MSK:  Back   Osteopathic findings  C2 flexed rotated and side bent right C6 flexed rotated and side bent left T3 extended rotated  and side bent right inhaled rib T9 extended rotated and side bent left        Assessment and Plan:  Right cervical radiculopathy Patient has been doing relatively well over the course of the last 4 years.  We discussed with patient about icing regimen and home exercises, which activities to do and which ones to avoid, increase activity slowly.  Follow-up again in 8 to 12 weeks    Nonallopathic problems  Decision today to treat with OMT was based on Physical Exam  After verbal consent patient was treated with HVLA, ME, FPR techniques in cervical, rib, thoracic,  areas  Patient tolerated the procedure well with improvement in symptoms  Patient given exercises, stretches and lifestyle modifications  See medications in patient instructions if given  Patient will follow up in 8-12 weeks    The above documentation has been reviewed and is accurate and complete Andrew Pulley, DO          Note: This dictation was prepared with Dragon dictation along with smaller phrase technology. Any transcriptional errors that result from this process are unintentional.

## 2022-08-18 ENCOUNTER — Encounter: Payer: Self-pay | Admitting: Family Medicine

## 2022-08-18 ENCOUNTER — Ambulatory Visit: Payer: BC Managed Care – PPO | Admitting: Family Medicine

## 2022-08-18 VITALS — BP 122/84 | HR 87 | Ht 76.0 in | Wt 235.0 lb

## 2022-08-18 DIAGNOSIS — M5412 Radiculopathy, cervical region: Secondary | ICD-10-CM

## 2022-08-18 DIAGNOSIS — M9901 Segmental and somatic dysfunction of cervical region: Secondary | ICD-10-CM

## 2022-08-18 DIAGNOSIS — M9908 Segmental and somatic dysfunction of rib cage: Secondary | ICD-10-CM | POA: Diagnosis not present

## 2022-08-18 DIAGNOSIS — M9902 Segmental and somatic dysfunction of thoracic region: Secondary | ICD-10-CM

## 2022-08-18 NOTE — Assessment & Plan Note (Signed)
Patient has been doing relatively well over the course of the last 4 years.  We discussed with patient about icing regimen and home exercises, which activities to do and which ones to avoid, increase activity slowly.  Follow-up again in 8 to 12 weeks

## 2022-08-18 NOTE — Patient Instructions (Signed)
Good to see you Have a great time in [place] See me in 6-8 weeks

## 2022-10-11 NOTE — Progress Notes (Signed)
  [name] [address] [place] [address] Phone: [phone] Subjective:   Andrew [name], am serving as a scribe for Dr. Antoine Primas.  I'm seeing this patient by the request  of:  [name], NP  CC: Neck pain follow-up  [id number]  Andrew [name] is a 42 y.o. male coming in with complaint of back and neck pain. OMT 08/18/2022. Patient states overall has been doing relatively well.  No significant discomfort at the moment.  Patient has been able to continue to stay active.  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   As well as notes that were available from care everywhere and other healthcare systems.  Past medical history, social, surgical and family history all reviewed in electronic medical record.  No pertanent information unless stated regarding to the chief complaint.   Past Medical History:  Diagnosis Date   Heart murmur    Palpitation      Objective  Blood pressure 126/86, pulse 87, height  (1.93 m), weight 249 lb (112.9 kg), SpO2 99 %.   General: No apparent distress alert and oriented x3 mood and affect normal, dressed appropriately.  HEENT: Pupils equal, extraocular movements intact  Respiratory: Patient's speak in full sentences and does not appear short of breath  Cardiovascular: No lower extremity edema, non tender, no erythema  Low back exam does have some mild loss of lordosis.  More tightness noted in the neck area.  Right side greater than left.  Still having right-sided thoracic spine as well.  Osteopathic findings  C6 flexed rotated and side bent right T5 extended rotated and side bent right inhaled rib L1 flexed rotated and side bent right Sacrum right on right     Assessment and Plan:  Right  cervical radiculopathy Right-sided does have some mild loss of lordosis.  Patient does have some limited sidebending right and left.  Patient also has some limited extension of the neck.  We discussed with patient about icing regimen and home exercises.  Responded extremely well to osteopathic manipulation.  Follow-up again in 2 to 3 months    Nonallopathic problems  Decision today to treat with OMT was based on Physical Exam  After verbal consent patient was treated with HVLA, ME, FPR techniques in cervical, rib, thoracic, lumbar, and sacral  areas  Patient tolerated the procedure well with improvement in symptoms  Patient given exercises, stretches and lifestyle modifications  See medications in patient instructions if given  Patient will follow up in 8-12 weeks    The above documentation has been reviewed and is accurate and complete Judi Saa, DO          Note: This dictation was prepared with Dragon dictation along with smaller phrase technology. Any transcriptional errors that result from this process are unintentional.

## 2022-10-12 ENCOUNTER — Other Ambulatory Visit: Payer: Self-pay | Admitting: Nurse Practitioner

## 2022-10-12 DIAGNOSIS — N529 Male erectile dysfunction, unspecified: Secondary | ICD-10-CM

## 2022-10-12 MED ORDER — SILDENAFIL CITRATE 25 MG PO TABS: 25.0000 mg | ORAL_TABLET | Freq: Every day | ORAL | 1 refills | Status: DC | PRN

## 2022-10-12 NOTE — Telephone Encounter (Signed)
Refill for sildenafil (VIAGRA) 25 MG tablet   LV- 03/08/22 NV- 03/12/23 LR- 06/27/22 ( 10 tabs/ no refills)

## 2022-10-16 ENCOUNTER — Ambulatory Visit: Payer: BC Managed Care – PPO | Admitting: Family Medicine

## 2022-10-16 ENCOUNTER — Encounter: Payer: Self-pay | Admitting: Family Medicine

## 2022-10-16 VITALS — BP 126/86 | HR 87 | Ht 76.0 in | Wt 249.0 lb

## 2022-10-16 DIAGNOSIS — M5412 Radiculopathy, cervical region: Secondary | ICD-10-CM

## 2022-10-16 DIAGNOSIS — M9901 Segmental and somatic dysfunction of cervical region: Secondary | ICD-10-CM

## 2022-10-16 DIAGNOSIS — M9904 Segmental and somatic dysfunction of sacral region: Secondary | ICD-10-CM

## 2022-10-16 DIAGNOSIS — M9903 Segmental and somatic dysfunction of lumbar region: Secondary | ICD-10-CM | POA: Diagnosis not present

## 2022-10-16 DIAGNOSIS — M9902 Segmental and somatic dysfunction of thoracic region: Secondary | ICD-10-CM

## 2022-10-16 DIAGNOSIS — M9908 Segmental and somatic dysfunction of rib cage: Secondary | ICD-10-CM | POA: Diagnosis not present

## 2022-10-16 NOTE — Assessment & Plan Note (Signed)
Right-sided does have some mild loss of lordosis.  Patient does have some limited sidebending right and left.  Patient also has some limited extension of the neck.  We discussed with patient about icing regimen and home exercises.  Responded extremely well to osteopathic manipulation.  Follow-up again in 2 to 3 months

## 2022-10-16 NOTE — Patient Instructions (Signed)
Good to see you!  Good luck with travel basketball See you again in 2-3 months

## 2022-12-07 NOTE — Progress Notes (Deleted)
  [name] [address] [place] [address] Phone: [phone] Subjective:    I'm seeing this patient by the request  of:  [name], NP  CC:   [id number]  Andrew [name] is a 42 y.o. male coming in with complaint of back and neck pain. OMT 10/16/2022. Patient states   Medications patient has been prescribed: None  Taking:         Reviewed prior external information including notes and imaging from previsou exam, outside providers and external EMR if available.   As well as notes that were available from care everywhere and other healthcare systems.  Past medical history, social, surgical and family history all reviewed in electronic medical record.  No pertanent information unless stated regarding to the chief complaint.   Past Medical History:  Diagnosis Date   Heart murmur    Palpitation     No Known Allergies   Review of Systems:  No headache, visual changes, nausea, vomiting, diarrhea, constipation, dizziness, abdominal pain, skin rash, fevers, chills, night sweats, weight loss, swollen lymph nodes, body aches, joint swelling, chest pain, shortness of breath, mood changes. POSITIVE muscle aches  Objective  There were no vitals taken for this visit.   General: No apparent distress alert and oriented x3 mood and affect normal, dressed appropriately.  HEENT: Pupils equal, extraocular movements intact  Respiratory: Patient's speak in full sentences and does not appear short of breath  Cardiovascular: No lower extremity edema, non tender, no erythema  Gait MSK:  Back   Osteopathic findings  C2 flexed rotated and side bent right C6 flexed rotated and side bent left T3 extended rotated and side bent right inhaled rib T9 extended rotated and side bent left L2 flexed rotated and side bent right Sacrum right on right       Assessment and Plan:  No problem-specific Assessment & Plan notes found for this  encounter.    Nonallopathic problems  Decision today to treat with OMT was based on Physical Exam  After verbal consent patient was treated with HVLA, ME, FPR techniques in cervical, rib, thoracic, lumbar, and sacral  areas  Patient tolerated the procedure well with improvement in symptoms  Patient given exercises, stretches and lifestyle modifications  See medications in patient instructions if given  Patient will follow up in 4-8 weeks             Note: This dictation was prepared with Dragon dictation along with smaller phrase technology. Any transcriptional errors that result from this process are unintentional.

## 2022-12-11 ENCOUNTER — Ambulatory Visit: Payer: BC Managed Care – PPO | Admitting: Family Medicine

## 2022-12-23 ENCOUNTER — Encounter: Payer: Self-pay | Admitting: Nurse Practitioner

## 2022-12-23 DIAGNOSIS — N529 Male erectile dysfunction, unspecified: Secondary | ICD-10-CM

## 2022-12-25 MED ORDER — SILDENAFIL CITRATE 25 MG PO TABS: 25.0000 mg | ORAL_TABLET | Freq: Every day | ORAL | 1 refills | Status: DC | PRN

## 2022-12-25 NOTE — Telephone Encounter (Signed)
LAST APPOINTMENT DATE: 03/08/22 CPE    NEXT APPOINTMENT DATE: 03/12/2023    LAST REFILL: 10/12/22  QTY: #10  1 RF

## 2023-01-30 NOTE — Progress Notes (Unsigned)
  [name] [address] [place] [address] Phone: [phone] Subjective:   Andrew [name], am serving as a scribe for Dr. Antoine Primas.  I'm seeing this patient by the request  of:  [name], NP  CC: Back and neck pain bilateral  [id number]  Andrew [name] is a 42 y.o. male coming in with complaint of back and neck pain. OMT 10/16/2022. Patient states continues to have tightness noted.  Some tenderness to palpation in the paraspinal musculature but nothing drastic.  Patient states that he is able to do all daily activities.  Medications patient has been prescribed: None  Taking:         Reviewed prior external information including notes and imaging from previsou exam, outside providers and external EMR if available.   As well as notes that were available from care everywhere and other healthcare systems.  Past medical history, social, surgical and family history all reviewed in electronic medical record.  No pertanent information unless stated regarding to the chief complaint.   Past Medical History:  Diagnosis Date   Heart murmur    Palpitation     No Known Allergies   Review of Systems:  No headache, visual changes, nausea, vomiting, diarrhea, constipation, dizziness, abdominal pain, skin rash, fevers, chills, night sweats, weight loss, swollen lymph nodes, body aches, joint swelling, chest pain, shortness of breath, mood changes. POSITIVE muscle aches  Objective  Blood pressure 122/88, pulse (!) 59, height 6\' 4"  (1.93 m), weight 253 lb (114.8 kg), SpO2 99%.   General: No apparent distress alert and oriented x3 mood and affect normal, dressed appropriately.  HEENT: Pupils equal, extraocular movements intact  Respiratory: Patient's speak in full sentences and does not appear short of breath  Cardiovascular: No lower extremity edema, non tender, no erythema  Gait MSK:  Back does have some loss of lordosis noted.   Neck exam notably seems to be of otitis.  And seems to be right greater than left.  Negative Spurling's.  Does have some tightness noted as well.  Osteopathic findings  C2 flexed rotated and side bent right C7 flexed rotated and side bent right T3 extended rotated and side bent right inhaled rib L1 flexed rotated and side bent right Sacrum right on right       Assessment and Plan:  Right cervical radiculopathy Likely patient has not had any true radicular symptoms for quite some time.  Not taking any medications.  Has responded well to osteopathic manipulation.  Encouraged to continue to work on strengthening exercises, posture and An[employer].  Follow-up again in 6 to 8 weeks otherwise.    Nonallopathic problems  Decision today to treat with OMT was based on Physical Exam  After verbal consent patient was treated with HVLA, ME, FPR techniques in cervical, rib, thoracic, lumbar, and sacral  areas  Patient tolerated the procedure well with improvement in symptoms  Patient given exercises, stretches and lifestyle modifications  See medications in patient instructions if given  Patient will follow up in 4-8 weeks    The above documentation has been reviewed and is accurate and complete Judi Saa, DO          Note: This dictation was prepared with Dragon dictation along with smaller phrase technology. Any transcriptional errors that result from this process are unintentional.

## 2023-01-31 ENCOUNTER — Ambulatory Visit: Payer: BC Managed Care – PPO | Admitting: Family Medicine

## 2023-01-31 ENCOUNTER — Encounter: Payer: Self-pay | Admitting: Family Medicine

## 2023-01-31 VITALS — BP 122/88 | HR 59 | Ht 76.0 in | Wt 253.0 lb

## 2023-01-31 DIAGNOSIS — M9904 Segmental and somatic dysfunction of sacral region: Secondary | ICD-10-CM | POA: Diagnosis not present

## 2023-01-31 DIAGNOSIS — M9908 Segmental and somatic dysfunction of rib cage: Secondary | ICD-10-CM

## 2023-01-31 DIAGNOSIS — M9903 Segmental and somatic dysfunction of lumbar region: Secondary | ICD-10-CM | POA: Diagnosis not present

## 2023-01-31 DIAGNOSIS — M5412 Radiculopathy, cervical region: Secondary | ICD-10-CM

## 2023-01-31 DIAGNOSIS — M9902 Segmental and somatic dysfunction of thoracic region: Secondary | ICD-10-CM | POA: Diagnosis not present

## 2023-01-31 DIAGNOSIS — M9901 Segmental and somatic dysfunction of cervical region: Secondary | ICD-10-CM | POA: Diagnosis not present

## 2023-01-31 NOTE — Assessment & Plan Note (Signed)
Likely patient has not had any true radicular symptoms for quite some time.  Not taking any medications.  Has responded well to osteopathic manipulation.  Encouraged to continue to work on strengthening exercises, posture and Anamix.  Follow-up again in 6 to 8 weeks otherwise.

## 2023-01-31 NOTE — Patient Instructions (Signed)
Nothing to add Thanks for starting my day off right See me in 3-4 months

## 2023-02-08 ENCOUNTER — Encounter (INDEPENDENT_AMBULATORY_CARE_PROVIDER_SITE_OTHER): Payer: Self-pay

## 2023-03-12 ENCOUNTER — Encounter: Payer: Self-pay | Admitting: Nurse Practitioner

## 2023-03-12 ENCOUNTER — Ambulatory Visit (INDEPENDENT_AMBULATORY_CARE_PROVIDER_SITE_OTHER): Payer: BC Managed Care – PPO | Admitting: Nurse Practitioner

## 2023-03-12 VITALS — BP 120/88 | HR 82 | Temp 97.8°F | Ht 76.0 in | Wt 251.2 lb

## 2023-03-12 DIAGNOSIS — E7800 Pure hypercholesterolemia, unspecified: Secondary | ICD-10-CM | POA: Insufficient documentation

## 2023-03-12 DIAGNOSIS — E669 Obesity, unspecified: Secondary | ICD-10-CM | POA: Insufficient documentation

## 2023-03-12 DIAGNOSIS — Z23 Encounter for immunization: Secondary | ICD-10-CM

## 2023-03-12 DIAGNOSIS — N529 Male erectile dysfunction, unspecified: Secondary | ICD-10-CM | POA: Diagnosis not present

## 2023-03-12 DIAGNOSIS — Z0000 Encounter for general adult medical examination without abnormal findings: Secondary | ICD-10-CM | POA: Diagnosis not present

## 2023-03-12 LAB — COMPREHENSIVE METABOLIC PANEL
ALT: 18 U/L (ref 0–53)
AST: 16 U/L (ref 0–37)
Albumin: 4.3 g/dL (ref 3.5–5.2)
Alkaline Phosphatase: 89 U/L (ref 39–117)
BUN: 12 mg/dL (ref 6–23)
CO2: 29 meq/L (ref 19–32)
Calcium: 9.3 mg/dL (ref 8.4–10.5)
Chloride: 104 meq/L (ref 96–112)
Creatinine, Ser: 1.04 mg/dL (ref 0.40–1.50)
GFR: 87.47 mL/min (ref >60.00)
Glucose, Bld: 83 mg/dL (ref 70–99)
Potassium: 4.6 meq/L (ref 3.5–5.1)
Sodium: 140 meq/L (ref 135–145)
Total Bilirubin: 0.6 mg/dL (ref 0.2–1.2)
Total Protein: 7 g/dL (ref 6.0–8.3)

## 2023-03-12 LAB — CBC
HCT: 46.1 % (ref 39.0–52.0)
Hemoglobin: 15 g/dL (ref 13.0–17.0)
MCHC: 32.6 g/dL (ref 30.0–36.0)
MCV: 88.9 fl (ref 78.0–100.0)
Platelets: 221 10*3/uL (ref 150.0–400.0)
RBC: 5.18 Mil/uL (ref 4.22–5.81)
RDW: 13.7 % (ref 11.5–15.5)
WBC: 3.9 10*3/uL — ABNORMAL LOW (ref 4.0–10.5)

## 2023-03-12 LAB — LIPID PANEL
Cholesterol: 202 mg/dL — ABNORMAL HIGH (ref 0–200)
HDL: 41.1 mg/dL (ref >39.00)
LDL Cholesterol: 141 mg/dL — ABNORMAL HIGH (ref 0–99)
NonHDL: 161.29
Total CHOL/HDL Ratio: 5
Triglycerides: 102 mg/dL (ref 0.0–149.0)
VLDL: 20.4 mg/dL (ref 0.0–40.0)

## 2023-03-12 LAB — TSH: TSH: 2.54 u[IU]/mL (ref 0.35–5.50)

## 2023-03-12 LAB — HEMOGLOBIN A1C: Hgb A1c MFr Bld: 5.3 % (ref 4.6–6.5)

## 2023-03-12 NOTE — Assessment & Plan Note (Signed)
Discussed age-appropriate immunizations and screening exams.  Did review patient's personal, surgical, social, family histories.  Patient up-to-date on all age-appropriate vaccinations he would like.  Update flu vaccine today.  Patient too young for CRC screening or prostate cancer screening.  Patient was given information at discharge about preventative healthcare maintenance with anticipatory guidance.

## 2023-03-12 NOTE — Progress Notes (Signed)
Established Patient Office Visit  Subjective   Patient ID: Andrew [name], male    DOB: 1981/03/11  Age: 42 y.o. MRN: [id number]  Chief Complaint  Patient presents with   Annual Exam    Yes to flu vaccine.     HPI  for complete physical and follow up of chronic conditions.   ED: Uses sildenafil as needed  Immunizations: -Tetanus: Completed in 2020 -Influenza: update today -Shingles: too young -Pneumonia: too young    Diet: Fair diet. State that he is doing 2 meals a day. With some snacks. Too much sugar. Water and juice  Exercise: States that he will walk a time 3 times a week   Eye exam: Completes annually few weeks ago and glasses  Dental exam: Completes semi-annually    Colonoscopy: too young, currently average risk  Lung Cancer Screening: NA   PSA: Too young, currently average risk   Sleep: states that he will go to bed around 10pm and get around 545-6. Feels rested and does snore.     Review of Systems  Constitutional:  Negative for chills and fever.  Respiratory:  Negative for shortness of breath.   Cardiovascular:  Negative for chest pain and leg swelling.  Gastrointestinal:  Negative for abdominal pain, blood in stool, constipation, diarrhea, nausea and vomiting.       BM  Genitourinary:  Negative for dysuria and hematuria.  Neurological:  Negative for tingling and headaches.  Psychiatric/Behavioral:  Negative for hallucinations and suicidal ideas.       Objective:     BP 120/88   Pulse 82   Temp 97.8 F (36.6 C) (Temporal)   Ht 6\' 4"  (1.93 m)   Wt 251 lb 3.2 oz (113.9 kg)   SpO2 98%   BMI 30.58 kg/m  BP Readings from Last 3 Encounters:  03/12/23 120/88  01/31/23 122/88  10/16/22 126/86   Wt Readings from Last 3 Encounters:  03/12/23 251 lb 3.2 oz (113.9 kg)  01/31/23 253 lb (114.8 kg)  10/16/22 249 lb (112.9 kg)      Physical Exam Vitals and nursing note reviewed. Exam conducted with a chaperone present Melina Copa,  CMA).  Constitutional:      Appearance: Normal appearance.  HENT:     Head:     Comments: Scarring to Bilateral TM    Right Ear: Tympanic membrane, ear canal and external ear normal.     Left Ear: Tympanic membrane, ear canal and external ear normal.     Mouth/Throat:     Mouth: Mucous membranes are moist.     Pharynx: Oropharynx is clear.  Eyes:     Extraocular Movements: Extraocular movements intact.     Pupils: Pupils are equal, round, and reactive to light.  Cardiovascular:     Rate and Rhythm: Normal rate and regular rhythm.     Pulses: Normal pulses.     Heart sounds: Normal heart sounds.  Pulmonary:     Effort: Pulmonary effort is normal.     Breath sounds: Normal breath sounds.  Abdominal:     General: Bowel sounds are normal. There is no distension.     Palpations: There is no mass.     Tenderness: There is no abdominal tenderness.     Hernia: No hernia is present. There is no hernia in the left inguinal area or right inguinal area.  Genitourinary:    Penis: Normal.      Testes: Normal.     Epididymis:  Right: Normal.     Left: Normal.  Musculoskeletal:     Right lower leg: No edema.     Left lower leg: No edema.  Lymphadenopathy:     Cervical: No cervical adenopathy.     Lower Body: No right inguinal adenopathy. No left inguinal adenopathy.  Skin:    General: Skin is warm.  Neurological:     General: No focal deficit present.     Mental Status: He is alert.     Deep Tendon Reflexes:     Reflex Scores:      Bicep reflexes are 2+ on the right side and 2+ on the left side.      Patellar reflexes are 2+ on the right side and 2+ on the left side.    Comments: Bilateral upper and lower extremity strength 5/5  Psychiatric:        Mood and Affect: Mood normal.        Behavior: Behavior normal.        Thought Content: Thought content normal.        Judgment: Judgment normal.      No results found for any visits on 03/12/23.    The 10-year ASCVD risk  score (Arnett DK, et al., 2019) is: 1.8%    Assessment & Plan:   Problem List Items Addressed This Visit       Other   Erectile dysfunction    Continue sildenafil 25 mg as needed.      Preventative health care - Primary    Discussed age-appropriate immunizations and screening exams.  Did review patient's personal, surgical, social, family histories.  Patient up-to-date on all age-appropriate vaccinations he would like.  Update flu vaccine today.  Patient too young for CRC screening or prostate cancer screening.  Patient was given information at discharge about preventative healthcare maintenance with anticipatory guidance.      Relevant Orders   CBC   Comprehensive metabolic panel   TSH   Elevated LDL cholesterol level    Elevated LDL in the past.  Continue work on healthy lifestyle modifications pending lipid panel today.      Relevant Orders   Hemoglobin A1c   Lipid panel   Obesity (BMI 30.0-34.9)    Pending TSH, lipid panel, A1c.  Did review the medical recommendations of 30 minutes of exercise 5 times a week.  Continue working on healthy lifestyle modifications      Relevant Orders   Hemoglobin A1c   Other Visit Diagnoses     Need for influenza vaccination           Return in about 1 year (around 03/11/2024) for CPE and Labs.    Audria Nine, NP

## 2023-03-12 NOTE — Patient Instructions (Addendum)
Nice to see you today I will be in touch with the labs once I have reviewed them  Follow up with me in 1 year, sooner if you need me We did update your flu vaccine today

## 2023-03-12 NOTE — Assessment & Plan Note (Signed)
Continue sildenafil 25 mg as needed

## 2023-03-12 NOTE — Assessment & Plan Note (Signed)
Elevated LDL in the past.  Continue work on healthy lifestyle modifications pending lipid panel today.

## 2023-03-12 NOTE — Assessment & Plan Note (Signed)
Pending TSH, lipid panel, A1c.  Did review the medical recommendations of 30 minutes of exercise 5 times a week.  Continue working on healthy lifestyle modifications

## 2023-03-12 NOTE — Addendum Note (Signed)
Addended by: Melina Copa on: 03/12/2023 08:44 AM   Modules accepted: Orders

## 2023-03-22 ENCOUNTER — Encounter: Payer: Self-pay | Admitting: Nurse Practitioner

## 2023-03-22 DIAGNOSIS — N529 Male erectile dysfunction, unspecified: Secondary | ICD-10-CM

## 2023-03-22 MED ORDER — SILDENAFIL CITRATE 25 MG PO TABS
25.0000 mg | ORAL_TABLET | Freq: Every day | ORAL | 1 refills | Status: AC | PRN
Start: 2023-03-22 — End: ?

## 2023-03-22 NOTE — Telephone Encounter (Signed)
Patient had cpe 03/12/23

## 2023-03-22 NOTE — Addendum Note (Signed)
Addended by: [name] on: 03/22/2023 01:52 PM   Modules accepted: Orders

## 2023-05-30 ENCOUNTER — Ambulatory Visit: Payer: BC Managed Care – PPO | Admitting: Family Medicine

## 2023-06-06 NOTE — Progress Notes (Signed)
[name] [address] [place] [address] Phone: [phone] Subjective:   Andrew [name], am serving as a scribe for Dr. Antoine [name].  I'm seeing this patient by the request  of:  Andrew Emms, NP  CC: Back and neck pain follow-up  BJY:[id number]  Andrew [name] is a 42 y.o. male coming in with complaint of back and neck pain. OMT 01/31/2023. Patient states continues to have some discomfort here and there.  Nothing that stopping him from activity per se.  Patient has not had any significant radicular symptoms.  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  Medications patient has been prescribed: None        Reviewed prior external information including notes and imaging from previsou exam, outside providers and external EMR if available.   As well as notes that were available from care everywhere and other healthcare systems.  Past medical history, social, surgical and family history all reviewed in electronic medical record.  No pertanent information unless stated regarding to the chief complaint.   Past Medical History:  Diagnosis Date   Cataract 2003   Removed   Heart murmur    Palpitation     No Known Allergies   Review of Systems:  No headache, visual changes, nausea, vomiting, diarrhea, constipation, dizziness, abdominal pain, skin rash, fevers, chills, night sweats, weight loss, swollen lymph nodes, body aches, joint swelling, chest pain, shortness of breath, mood changes. POSITIVE muscle aches  Objective  Blood pressure 118/84, pulse 78, height 6\' 4"  (1.93 m), weight 238 lb (108 kg), SpO2 96%.   General: No apparent distress alert and oriented x3 mood and affect normal, dressed appropriately.  HEENT: Pupils equal, extraocular movements intact  Respiratory: Patient's speak in full sentences and does not appear short of breath  Cardiovascular: No lower extremity edema, non tender, no erythema   Gait MSK:  Back overall relatively normal.  Does have some mild tightness noted over the sacroiliac joint.  Significant tightness still noted in the neck still.  Does have some limited range of motion especially with right-sided sidebending and left-sided rotation.  Osteopathic findings  C2 flexed rotated and side bent right C3 flexed rotated and side bent left T2 extended rotated and side bent right i with elevated first rib T9 extended rotated and side bent left L2 flexed rotated and side bent right Sacrum right on right       Assessment and Plan:  Right cervical radiculopathy Right cervical radiculopathy.  Likely has not had any radicular symptoms in quite some time.  Still working on [employer], patient does do a lot of computer work so we are continuing to monitor that as well.  No change in medications at the moment will refill accordingly.  Responding extremely well though to osteopathic manipulation when needed.  Follow-up again in 6 to 8 weeks otherwise.    Nonallopathic problems  Decision today to treat with OMT was based on Physical Exam  After verbal consent patient was treated with HVLA, ME, FPR techniques in cervical, rib, thoracic, lumbar, and sacral  areas  Patient tolerated the procedure well with improvement in symptoms  Patient given exercises, stretches and lifestyle modifications  See medications in patient instructions if given  Patient will follow up in 4-8 weeks     The above documentation has been reviewed and is accurate and complete Andrew Saa, DO  Note: This dictation was prepared with Dragon dictation along with smaller phrase technology. Any transcriptional errors that result from this process are unintentional.

## 2023-06-08 ENCOUNTER — Encounter: Payer: Self-pay | Admitting: Family Medicine

## 2023-06-08 ENCOUNTER — Ambulatory Visit: Payer: BC Managed Care – PPO | Admitting: Family Medicine

## 2023-06-08 VITALS — BP 118/84 | HR 78 | Ht 76.0 in | Wt 238.0 lb

## 2023-06-08 DIAGNOSIS — M9904 Segmental and somatic dysfunction of sacral region: Secondary | ICD-10-CM

## 2023-06-08 DIAGNOSIS — M5412 Radiculopathy, cervical region: Secondary | ICD-10-CM | POA: Diagnosis not present

## 2023-06-08 DIAGNOSIS — M9901 Segmental and somatic dysfunction of cervical region: Secondary | ICD-10-CM | POA: Diagnosis not present

## 2023-06-08 DIAGNOSIS — M9908 Segmental and somatic dysfunction of rib cage: Secondary | ICD-10-CM | POA: Diagnosis not present

## 2023-06-08 DIAGNOSIS — M9902 Segmental and somatic dysfunction of thoracic region: Secondary | ICD-10-CM | POA: Diagnosis not present

## 2023-06-08 DIAGNOSIS — M9903 Segmental and somatic dysfunction of lumbar region: Secondary | ICD-10-CM

## 2023-06-08 NOTE — Assessment & Plan Note (Signed)
Right cervical radiculopathy.  Likely has not had any radicular symptoms in quite some time.  Still working on [employer], patient does do a lot of computer work so we are continuing to monitor that as well.  No change in medications at the moment will refill accordingly.  Responding extremely well though to osteopathic manipulation when needed.  Follow-up again in 6 to 8 weeks otherwise.

## 2023-06-08 NOTE — Patient Instructions (Signed)
Great to see you Overall doing wonderful [employer] fitness  See me again in 2-3 months

## 2023-09-13 NOTE — Progress Notes (Unsigned)
  [name] [address] [place] [address] Phone: [phone] Subjective:   Andrew [name], am serving as a scribe for Dr. Antoine Primas.  I'm seeing this patient by the request  of:  [name], NP  CC: Back and neck pain follow-up  [id number]  Andrew [name] is a 43 y.o. male coming in with complaint of back and neck pain. OMT 06/08/2023. Patient states that his R arm is starting to bother him more recently but overall he is doing well.   Medications patient has been prescribed: None  Taking:       Reviewed prior external information including notes and imaging from previsou exam, outside providers and external EMR if available.   As well as notes that were available from care everywhere and other healthcare systems.  Past medical history, social, surgical and family history all reviewed in electronic medical record.  No pertanent information unless stated regarding to the chief complaint.   Past Medical History:  Diagnosis Date   Cataract 2003   Removed   Heart murmur    Palpitation       Review of Systems:  No headache, visual changes, nausea, vomiting, diarrhea, constipation, dizziness, abdominal pain, skin rash, fevers, chills, night sweats, weight loss, swollen lymph nodes, body aches, joint swelling, chest pain, shortness of breath, mood changes. POSITIVE muscle aches  Objective  Blood pressure 128/84, pulse 76, height 6\' 4"  (1.93 m), SpO2 99%.   General: No apparent distress alert and oriented x3 mood and affect normal, dressed appropriately.  HEENT: Pupils equal, extraocular movements intact  Respiratory: Patient's speak in full sentences and does not appear short of breath  Cardiovascular: No lower extremity edema, non tender, no erythema  Neck exam does have some loss lordosis noted.  Negative Spurling's today but does have more tightness on the right side of the neck  Osteopathic findings  C5 flexed  rotated and side bent right C7 flexed rotated and side bent right T3 extended rotated and side bent right inhaled rib T8 extended rotated and side bent left L1 flexed rotated and side bent right Sacrum right on right       Assessment and Plan:  Right cervical radiculopathy Right-sided cervical radiculopathy noted.  Discussed icing regimen of home exercises, discussed which activities to do and which ones to avoid.  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  No change in medications if necessary will consider the gabapentin.  Follow-up with me again in 6 to 8 weeks    Nonallopathic problems  Decision today to treat with OMT was based on Physical Exam  After verbal consent patient was treated with HVLA, ME, FPR techniques in cervical, rib, thoracic, lumbar, and sacral  areas  Patient tolerated the procedure well with improvement in symptoms  Patient given exercises, stretches and lifestyle modifications  See medications in patient instructions if given  Patient will follow up in 4-8 weeks     The above documentation has been reviewed and is accurate and complete Judi Saa, DO         Note: This dictation was prepared with Dragon dictation along with smaller phrase technology. Any transcriptional errors that result from this process are unintentional.

## 2023-09-14 ENCOUNTER — Ambulatory Visit: Payer: BC Managed Care – PPO | Admitting: Family Medicine

## 2023-09-14 ENCOUNTER — Encounter: Payer: Self-pay | Admitting: Family Medicine

## 2023-09-14 VITALS — BP 128/84 | HR 76 | Ht 76.0 in

## 2023-09-14 DIAGNOSIS — M9904 Segmental and somatic dysfunction of sacral region: Secondary | ICD-10-CM

## 2023-09-14 DIAGNOSIS — M9903 Segmental and somatic dysfunction of lumbar region: Secondary | ICD-10-CM

## 2023-09-14 DIAGNOSIS — M9901 Segmental and somatic dysfunction of cervical region: Secondary | ICD-10-CM

## 2023-09-14 DIAGNOSIS — M5412 Radiculopathy, cervical region: Secondary | ICD-10-CM | POA: Diagnosis not present

## 2023-09-14 DIAGNOSIS — M9902 Segmental and somatic dysfunction of thoracic region: Secondary | ICD-10-CM

## 2023-09-14 DIAGNOSIS — M9908 Segmental and somatic dysfunction of rib cage: Secondary | ICD-10-CM | POA: Diagnosis not present

## 2023-09-14 NOTE — Assessment & Plan Note (Signed)
 Right-sided cervical radiculopathy noted.  Discussed icing regimen of home exercises, discussed which activities to do and which ones to avoid.  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  No change in medications if necessary will consider the gabapentin.  Follow-up with me again in 6 to 8 weeks

## 2023-09-14 NOTE — Patient Instructions (Signed)
 Good to see you Stay active Watch ergonomics with dissertation See me in 6-8 weeks

## 2023-10-29 NOTE — Progress Notes (Unsigned)
  [name] [address] [place] [address] Phone: [phone] Subjective:   Andrew [name], am serving as a scribe for Dr. Ronnell [name].  I'm seeing this patient by the request  of:  Andrew Gaster, NP  CC: back and neck pain follow up   [id number]  Andrew [name] is a 43 y.o. male coming in with complaint of back and neck pain. OMT 09/14/2023. Patient states doing well since last appointment. No new symptoms or concerns.  Medications patient has been prescribed: None  Taking:         Reviewed prior external information including notes and imaging from previsou exam, outside providers and external EMR if available.   As well as notes that were available from care everywhere and other healthcare systems.  Past medical history, social, surgical and family history all reviewed in electronic medical record.  No pertanent information unless stated regarding to the chief complaint.   Past Medical History:  Diagnosis Date   Cataract 2003   Removed   Heart murmur    Palpitation     No Known Allergies   Review of Systems:  No headache, visual changes, nausea, vomiting, diarrhea, constipation, dizziness, abdominal pain, skin rash, fevers, chills, night sweats, weight loss, swollen lymph nodes, body aches, joint swelling, chest pain, shortness of breath, mood changes. POSITIVE muscle aches  Objective  Blood pressure 114/80, pulse 83, height 6\' 4"  (1.93 m), weight 245 lb (111.1 kg), SpO2 97%.   General: No apparent distress alert and oriented x3 mood and affect normal, dressed appropriately.  HEENT: Pupils equal, extraocular movements intact  Respiratory: Patient's speak in full sentences and does not appear short of breath  Cardiovascular: No lower extremity edema, non tender, no erythema  Some tightness notedNeck exam still with paraspinal musculature.  Osteopathic findings  C2 flexed rotated and side bent right C6 flexed  rotated and side bent right T3 extended rotated and side bent right inhaled rib T7 extended rotated and side bent left L3 flexed rotated and side bent right Sacrum right on right     Assessment and Plan:  Right cervical radiculopathy Continued stiffness noted.  Discussed icing regimen of home exercises, discussed which activities to do and which ones to avoid.  Discussed continuing to monitor the sidebending and the extension.  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  Follow-up again in 6 to 8 weeks    Nonallopathic problems  Decision today to treat with OMT was based on Physical Exam  After verbal consent patient was treated with HVLA, ME, FPR techniques in cervical, rib, thoracic, lumbar, and sacral  areas  Patient tolerated the procedure well with improvement in symptoms  Patient given exercises, stretches and lifestyle modifications  See medications in patient instructions if given  Patient will follow up in 4-8 weeks     The above documentation has been reviewed and is accurate and complete Andrew [name] M Andrew Gintz, DO         Note: This dictation was prepared with Dragon dictation along with smaller phrase technology. Any transcriptional errors that result from this process are unintentional.

## 2023-10-31 ENCOUNTER — Encounter: Payer: Self-pay | Admitting: Family Medicine

## 2023-10-31 ENCOUNTER — Ambulatory Visit: Admitting: Family Medicine

## 2023-10-31 VITALS — BP 114/80 | HR 83 | Ht 76.0 in | Wt 245.0 lb

## 2023-10-31 DIAGNOSIS — M5412 Radiculopathy, cervical region: Secondary | ICD-10-CM

## 2023-10-31 DIAGNOSIS — M9903 Segmental and somatic dysfunction of lumbar region: Secondary | ICD-10-CM

## 2023-10-31 DIAGNOSIS — M9908 Segmental and somatic dysfunction of rib cage: Secondary | ICD-10-CM | POA: Diagnosis not present

## 2023-10-31 DIAGNOSIS — M9901 Segmental and somatic dysfunction of cervical region: Secondary | ICD-10-CM | POA: Diagnosis not present

## 2023-10-31 DIAGNOSIS — M9902 Segmental and somatic dysfunction of thoracic region: Secondary | ICD-10-CM | POA: Diagnosis not present

## 2023-10-31 DIAGNOSIS — M9904 Segmental and somatic dysfunction of sacral region: Secondary | ICD-10-CM | POA: Diagnosis not present

## 2023-10-31 NOTE — Patient Instructions (Signed)
 Watch the monitor and the mouse Good luck with busy summer See you again in 7-8 weeks

## 2023-10-31 NOTE — Assessment & Plan Note (Signed)
 Continued stiffness noted.  Discussed icing regimen of home exercises, discussed which activities to do and which ones to avoid.  Discussed continuing to monitor the sidebending and the extension.  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  Follow-up again in 6 to 8 weeks

## 2023-12-13 NOTE — Progress Notes (Unsigned)
  [name] [address] [place] [address] Phone: [phone] Subjective:    I'm seeing this patient by the request  of:  Dorothe Gaster, NP  CC:   BJY:[id number]  Andrew [name] is a 43 y.o. male coming in with complaint of back and neck pain. OMT 10/31/2023. Patient states   Medications patient has been prescribed: None  Taking:         Reviewed prior external information including notes and imaging from previsou exam, outside providers and external EMR if available.   As well as notes that were available from care everywhere and other healthcare systems.  Past medical history, social, surgical and family history all reviewed in electronic medical record.  No pertanent information unless stated regarding to the chief complaint.   Past Medical History:  Diagnosis Date   Cataract 2003   Removed   Heart murmur    Palpitation     No Known Allergies   Review of Systems:  No headache, visual changes, nausea, vomiting, diarrhea, constipation, dizziness, abdominal pain, skin rash, fevers, chills, night sweats, weight loss, swollen lymph nodes, body aches, joint swelling, chest pain, shortness of breath, mood changes. POSITIVE muscle aches  Objective  There were no vitals taken for this visit.   General: No apparent distress alert and oriented x3 mood and affect normal, dressed appropriately.  HEENT: Pupils equal, extraocular movements intact  Respiratory: Patient's speak in full sentences and does not appear short of breath  Cardiovascular: No lower extremity edema, non tender, no erythema  Gait MSK:  Back   Osteopathic findings  C2 flexed rotated and side bent right C6 flexed rotated and side bent left T3 extended rotated and side bent right inhaled rib T9 extended rotated and side bent left L2 flexed rotated and side bent right Sacrum right on right       Assessment and Plan:  No problem-specific Assessment &  Plan notes found for this encounter.    Nonallopathic problems  Decision today to treat with OMT was based on Physical Exam  After verbal consent patient was treated with HVLA, ME, FPR techniques in cervical, rib, thoracic, lumbar, and sacral  areas  Patient tolerated the procedure well with improvement in symptoms  Patient given exercises, stretches and lifestyle modifications  See medications in patient instructions if given  Patient will follow up in 4-8 weeks             Note: This dictation was prepared with Dragon dictation along with smaller phrase technology. Any transcriptional errors that result from this process are unintentional.

## 2023-12-19 ENCOUNTER — Encounter: Payer: Self-pay | Admitting: Family Medicine

## 2023-12-19 ENCOUNTER — Ambulatory Visit: Admitting: Family Medicine

## 2023-12-19 VITALS — BP 122/84 | HR 90 | Ht 76.0 in | Wt 252.0 lb

## 2023-12-19 DIAGNOSIS — M9908 Segmental and somatic dysfunction of rib cage: Secondary | ICD-10-CM

## 2023-12-19 DIAGNOSIS — M9902 Segmental and somatic dysfunction of thoracic region: Secondary | ICD-10-CM

## 2023-12-19 DIAGNOSIS — M9904 Segmental and somatic dysfunction of sacral region: Secondary | ICD-10-CM | POA: Diagnosis not present

## 2023-12-19 DIAGNOSIS — M9903 Segmental and somatic dysfunction of lumbar region: Secondary | ICD-10-CM | POA: Diagnosis not present

## 2023-12-19 DIAGNOSIS — M9901 Segmental and somatic dysfunction of cervical region: Secondary | ICD-10-CM | POA: Diagnosis not present

## 2023-12-19 DIAGNOSIS — M5412 Radiculopathy, cervical region: Secondary | ICD-10-CM

## 2023-12-19 NOTE — Patient Instructions (Signed)
 Kick ass on your paper See me in 6-8 weeks

## 2023-12-19 NOTE — Assessment & Plan Note (Signed)
 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  Increase activity slowly.  Follow-up again in 6 to 8 weeks

## 2024-02-07 NOTE — Progress Notes (Unsigned)
  [name] [address] [place] [address] Phone: [phone] Subjective:   LILLETTE Berwyn Posey, am serving as a scribe for Dr. Arthea Claudene.  I'm seeing this patient by the request  of:  Wendee Lynwood HERO, NP  CC: Back and neck pain follow-up  YEP:Dlagzrupcz  Ashtyn Freilich is a 43 y.o. male coming in with complaint of back and neck pain. OMT 12/19/2023. Patient states significant improvement in the neck pain than what he was having previously.  No radicular symptoms.  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  Medications patient has been prescribed: None  Taking:         Reviewed prior external information including notes and imaging from previsou exam, outside providers and external EMR if available.   As well as notes that were available from care everywhere and other healthcare systems.  Past medical history, social, surgical and family history all reviewed in electronic medical record.  No pertanent information unless stated regarding to the chief complaint.   Past Medical History:  Diagnosis Date   Cataract 2003   Removed   Heart murmur    Palpitation     No Known Allergies   Review of Systems:  No headache, visual changes, nausea, vomiting, diarrhea, constipation, dizziness, abdominal pain, skin rash, fevers, chills, night sweats, weight loss, swollen lymph nodes, body aches, joint swelling, chest pain, shortness of breath, mood changes. POSITIVE muscle aches  Objective  Blood pressure 120/82, pulse 73, height 6' 4 (1.93 m), weight 257 lb (116.6 kg), SpO2 96%.   General: No apparent distress alert and oriented x3 mood and affect normal, dressed appropriately.  HEENT: Pupils equal, extraocular movements intact  Respiratory: Patient's speak in full sentences and does not appear short of breath  Cardiovascular: No lower extremity edema, non tender, no erythema  Gait MSK:  Back  Neck exam shows  still some mild loss of lordosis noted.  Some tenderness to palpation in the paraspinal musculature.  Tightness with sidebending bilaterally.  Osteopathic findings  C2 flexed rotated and side bent right C3 flexed rotated and side bent right C6 flexed rotated and side bent left C7 flexed rotated and side bent right T3 extended rotated and side bent right inhaled rib T6 extended rotated and side bent left T7 extended rotated and side bent right L1 flexed rotated and side bent right Sacrum right on right       Assessment and Plan:  Right cervical radiculopathy Discussed which activities to do and which ones to avoid.  Increasing activity slowly.  Discussed icing regimen of home exercises.  Increasing activity slowly.  Discussed icing regimen.  Follow-up again in 6 to 8 weeks.    Nonallopathic problems  Decision today to treat with OMT was based on Physical Exam  After verbal consent patient was treated with HVLA, ME, FPR techniques in cervical, rib, thoracic, lumbar, and sacral  areas  Patient tolerated the procedure well with improvement in symptoms  Patient given exercises, stretches and lifestyle modifications  See medications in patient instructions if given  Patient will follow up in 4-8 weeks    The above documentation has been reviewed and is accurate and complete Kinsler Soeder M Karita Dralle, DO          Note: This dictation was prepared with Dragon dictation along with smaller phrase technology. Any transcriptional errors that result from this process are unintentional.

## 2024-02-13 ENCOUNTER — Encounter: Payer: Self-pay | Admitting: Family Medicine

## 2024-02-13 ENCOUNTER — Ambulatory Visit: Admitting: Family Medicine

## 2024-02-13 VITALS — BP 120/82 | HR 73 | Ht 76.0 in | Wt 257.0 lb

## 2024-02-13 DIAGNOSIS — M5412 Radiculopathy, cervical region: Secondary | ICD-10-CM | POA: Diagnosis not present

## 2024-02-13 DIAGNOSIS — M9908 Segmental and somatic dysfunction of rib cage: Secondary | ICD-10-CM | POA: Diagnosis not present

## 2024-02-13 DIAGNOSIS — M9901 Segmental and somatic dysfunction of cervical region: Secondary | ICD-10-CM

## 2024-02-13 DIAGNOSIS — M9902 Segmental and somatic dysfunction of thoracic region: Secondary | ICD-10-CM

## 2024-02-13 DIAGNOSIS — M9904 Segmental and somatic dysfunction of sacral region: Secondary | ICD-10-CM | POA: Diagnosis not present

## 2024-02-13 DIAGNOSIS — M9903 Segmental and somatic dysfunction of lumbar region: Secondary | ICD-10-CM

## 2024-02-13 NOTE — Assessment & Plan Note (Signed)
 Discussed which activities to do and which ones to avoid.  Increasing activity slowly.  Discussed icing regimen of home exercises.  Increasing activity slowly.  Discussed icing regimen.  Follow-up again in 6 to 8 weeks.

## 2024-02-13 NOTE — Patient Instructions (Addendum)
Good to see you See me again in 2 months 

## 2024-03-04 ENCOUNTER — Other Ambulatory Visit: Payer: BC Managed Care – PPO

## 2024-03-11 ENCOUNTER — Ambulatory Visit: Payer: BC Managed Care – PPO | Admitting: Nurse Practitioner

## 2024-03-11 ENCOUNTER — Encounter: Payer: Self-pay | Admitting: Nurse Practitioner

## 2024-03-11 VITALS — BP 122/80 | HR 76 | Temp 97.8°F | Ht 75.25 in | Wt 257.0 lb

## 2024-03-11 DIAGNOSIS — N529 Male erectile dysfunction, unspecified: Secondary | ICD-10-CM | POA: Diagnosis not present

## 2024-03-11 DIAGNOSIS — Z0000 Encounter for general adult medical examination without abnormal findings: Secondary | ICD-10-CM | POA: Diagnosis not present

## 2024-03-11 DIAGNOSIS — Z6831 Body mass index (BMI) 31.0-31.9, adult: Secondary | ICD-10-CM

## 2024-03-11 DIAGNOSIS — Z131 Encounter for screening for diabetes mellitus: Secondary | ICD-10-CM

## 2024-03-11 DIAGNOSIS — E7800 Pure hypercholesterolemia, unspecified: Secondary | ICD-10-CM | POA: Diagnosis not present

## 2024-03-11 DIAGNOSIS — M5412 Radiculopathy, cervical region: Secondary | ICD-10-CM | POA: Diagnosis not present

## 2024-03-11 DIAGNOSIS — Z23 Encounter for immunization: Secondary | ICD-10-CM | POA: Diagnosis not present

## 2024-03-11 DIAGNOSIS — E66811 Obesity, class 1: Secondary | ICD-10-CM

## 2024-03-11 DIAGNOSIS — Z1211 Encounter for screening for malignant neoplasm of colon: Secondary | ICD-10-CM

## 2024-03-11 LAB — HEMOGLOBIN A1C: Hgb A1c MFr Bld: 5.6 % (ref 4.6–6.5)

## 2024-03-11 LAB — CBC WITH DIFFERENTIAL/PLATELET
Basophils Absolute: 0 K/uL (ref 0.0–0.1)
Basophils Relative: 1 % (ref 0.0–3.0)
Eosinophils Absolute: 0.1 K/uL (ref 0.0–0.7)
Eosinophils Relative: 2.4 % (ref 0.0–5.0)
HCT: 44.4 % (ref 39.0–52.0)
Hemoglobin: 14.7 g/dL (ref 13.0–17.0)
Lymphocytes Relative: 33.3 % (ref 12.0–46.0)
Lymphs Abs: 1.3 K/uL (ref 0.7–4.0)
MCHC: 33.1 g/dL (ref 30.0–36.0)
MCV: 87.2 fl (ref 78.0–100.0)
Monocytes Absolute: 0.4 K/uL (ref 0.1–1.0)
Monocytes Relative: 11.1 % (ref 3.0–12.0)
Neutro Abs: 2.1 K/uL (ref 1.4–7.7)
Neutrophils Relative %: 52.2 % (ref 43.0–77.0)
Platelets: 218 K/uL (ref 150.0–400.0)
RBC: 5.09 Mil/uL (ref 4.22–5.81)
RDW: 13.7 % (ref 11.5–15.5)
WBC: 4 K/uL (ref 4.0–10.5)

## 2024-03-11 LAB — COMPREHENSIVE METABOLIC PANEL WITH GFR
ALT: 19 U/L (ref 0–53)
AST: 17 U/L (ref 0–37)
Albumin: 4.4 g/dL (ref 3.5–5.2)
Alkaline Phosphatase: 88 U/L (ref 39–117)
BUN: 12 mg/dL (ref 6–23)
CO2: 27 meq/L (ref 19–32)
Calcium: 9.1 mg/dL (ref 8.4–10.5)
Chloride: 104 meq/L (ref 96–112)
Creatinine, Ser: 0.97 mg/dL (ref 0.40–1.50)
GFR: 94.43 mL/min (ref >60.00)
Glucose, Bld: 86 mg/dL (ref 70–99)
Potassium: 4.3 meq/L (ref 3.5–5.1)
Sodium: 139 meq/L (ref 135–145)
Total Bilirubin: 0.7 mg/dL (ref 0.2–1.2)
Total Protein: 6.8 g/dL (ref 6.0–8.3)

## 2024-03-11 LAB — LIPID PANEL
Cholesterol: 204 mg/dL — ABNORMAL HIGH (ref 0–200)
HDL: 37.6 mg/dL — ABNORMAL LOW
LDL Cholesterol: 150 mg/dL — ABNORMAL HIGH (ref 0–99)
NonHDL: 166.76
Total CHOL/HDL Ratio: 5
Triglycerides: 84 mg/dL (ref 0.0–149.0)
VLDL: 16.8 mg/dL (ref 0.0–40.0)

## 2024-03-11 LAB — TSH: TSH: 2.3 u[IU]/mL (ref 0.35–5.50)

## 2024-03-11 NOTE — Assessment & Plan Note (Addendum)
 Managed by sports medicine.  Continue

## 2024-03-11 NOTE — Patient Instructions (Signed)
 Nice to see you today We did update your flu vaccine today Follow up with me in 1 year, sooner if you need me

## 2024-03-11 NOTE — Assessment & Plan Note (Signed)
 History of the same did discuss healthy lifestyle modifications inclusive of medical recommendation of exercise.  Pending LDL today

## 2024-03-11 NOTE — Assessment & Plan Note (Signed)
 Discussed age-appropriate musicians and screening exams.  Did review patient's personal, surgical, social, family histories.  Patient is up-to-date on all age-appropriate vaccinations he would like.  Update flu vaccine today.  Discussed HPV vaccine and gave information at discharge.  Ambulatory referral for CRC screening today.  Patient is too young for prostate cancer screening.  Patient was given information at discharge about preventative healthcare maintenance with anticipatory guidance.

## 2024-03-11 NOTE — Addendum Note (Signed)
 Addended by: SEBASTIAN SHU on: 03/11/2024 09:08 AM   Modules accepted: Orders

## 2024-03-11 NOTE — Progress Notes (Signed)
 Established Patient Office Visit  Subjective   Patient ID: Andrew [name], male    DOB: March 09, 1981  Age: 43 y.o. MRN: [id number]  Chief Complaint  Patient presents with   Annual Exam    Would like flu vaccine    HPI  ED: Patient currently maintained on sildenafil  25 mg daily as needed sexual intercourse.  Cervical radiculopathy: Patient is followed by sports medicine  for complete physical and follow up of chronic conditions.  Immunizations: -Tetanus: Completed in 2020 -Influenza: update today  -Shingles: Too young -Pneumonia: Too young -HPV: Discussed in office  Diet: Fair diet. He is eating 2 meals a day and will snakcs. He is doing water and juice and soda ocassion  Exercise:  He is walking 3 time s a week 30-60 mins   Eye exam: Completes annually.  Wears glasses Dental exam: Completes semi-annually    Colonoscopy: Ambulatory referral to [place] [facility] Cancer Screening: N/A  PSA: Too young, currently average risk  Sleep: going to bed 930-10 and up at 530. Feels rested. States that he will snore when he first goes to sleep        Review of Systems  Constitutional:  Negative for chills and fever.  Respiratory:  Negative for shortness of breath.   Cardiovascular:  Negative for chest pain and leg swelling.  Gastrointestinal:  Negative for abdominal pain, blood in stool, constipation, diarrhea, nausea and vomiting.       BM daily   Genitourinary:  Negative for dysuria and hematuria.  Neurological:  Negative for tingling and headaches.  Psychiatric/Behavioral:  Negative for hallucinations and suicidal ideas.       Objective:     BP 122/80   Pulse 76   Temp 97.8 F (36.6 C) (Oral)   Ht 6' 3.25 (1.911 m)   Wt 257 lb (116.6 kg)   SpO2 95%   BMI 31.91 kg/m  BP Readings from Last 3 Encounters:  03/11/24 122/80  02/13/24 120/82  12/19/23 122/84   Wt Readings from Last 3 Encounters:  03/11/24 257 lb (116.6 kg)  02/13/24 257 lb (116.6 kg)   12/19/23 252 lb (114.3 kg)   SpO2 Readings from Last 3 Encounters:  03/11/24 95%  02/13/24 96%  12/19/23 97%      Physical Exam Vitals and nursing note reviewed.  Constitutional:      Appearance: Normal appearance.  HENT:     Right Ear: Tympanic membrane, ear canal and external ear normal.     Left Ear: Tympanic membrane, ear canal and external ear normal.     Ears:     Comments: Scarring to bilateral TMs    Mouth/Throat:     Mouth: Mucous membranes are moist.     Pharynx: Oropharynx is clear.  Eyes:     Extraocular Movements: Extraocular movements intact.     Pupils: Pupils are equal, round, and reactive to light.  Cardiovascular:     Rate and Rhythm: Normal rate and regular rhythm.     Pulses: Normal pulses.     Heart sounds: Normal heart sounds.  Pulmonary:     Effort: Pulmonary effort is normal.     Breath sounds: Normal breath sounds.  Abdominal:     General: Bowel sounds are normal. There is no distension.     Palpations: There is no mass.     Tenderness: There is no abdominal tenderness.     Hernia: No hernia is present.  Genitourinary:    Comments: deferred Musculoskeletal:  Right lower leg: No edema.     Left lower leg: No edema.  Lymphadenopathy:     Cervical: No cervical adenopathy.  Skin:    General: Skin is warm.  Neurological:     General: No focal deficit present.     Mental Status: He is alert.     Deep Tendon Reflexes:     Reflex Scores:      Bicep reflexes are 2+ on the right side and 2+ on the left side.      Patellar reflexes are 2+ on the right side and 2+ on the left side.    Comments: Bilateral upper and lower extremity strength 5/5  Psychiatric:        Mood and Affect: Mood normal.        Behavior: Behavior normal.        Thought Content: Thought content normal.        Judgment: Judgment normal.      No results found for any visits on 03/11/24.    The 10-year ASCVD risk score (Arnett DK, et al., 2019) is: 2.4%     Assessment & Plan:   Problem List Items Addressed This Visit       Nervous and Auditory   Right cervical radiculopathy   Managed by sports medicine.  Continue        Other   Erectile dysfunction   Relevant Orders   Hemoglobin A1c   Lipid panel   Preventative health care - Primary   Discussed age-appropriate musicians and screening exams.  Did review patient's personal, surgical, social, family histories.  Patient is up-to-date on all age-appropriate vaccinations he would like.  Update flu vaccine today.  Discussed HPV vaccine and gave information at discharge.  Ambulatory referral for CRC screening today.  Patient is too young for prostate cancer screening.  Patient was given information at discharge about preventative healthcare maintenance with anticipatory guidance.      Relevant Orders   CBC with Differential/Platelet   Comprehensive metabolic panel with GFR   TSH   Elevated LDL cholesterol level   History of the same did discuss healthy lifestyle modifications inclusive of medical recommendation of exercise.  Pending LDL today      Relevant Orders   Lipid panel   Obesity (BMI 30.0-34.9)   Discussed medical recommendation in regards to exercise.  Pending TSH, A1c, lipid panel.  Continue working on healthy lifestyle modifications      Relevant Orders   Hemoglobin A1c   Lipid panel   Other Visit Diagnoses       Screening for colon cancer       Relevant Orders   Ambulatory referral to Gastroenterology     Screening for diabetes mellitus       Relevant Orders   Hemoglobin A1c       Return in about 1 year (around 03/11/2025) for CPE and Labs.    Adina Crandall, NP

## 2024-03-11 NOTE — Assessment & Plan Note (Signed)
 Discussed medical recommendation in regards to exercise.  Pending TSH, A1c, lipid panel.  Continue working on healthy lifestyle modifications

## 2024-03-14 ENCOUNTER — Ambulatory Visit: Payer: Self-pay | Admitting: Nurse Practitioner

## 2024-04-11 NOTE — Progress Notes (Unsigned)
  [name] [address] [place] [address] Phone: [phone] Subjective:   Andrew [name], am serving as a scribe for Dr. Arthea [name].  I'm seeing this patient by the request  of:  Andrew Lynwood HERO, NP  CC: Back and neck pain follow-up  YEP:Dlagzrupcz  Andrew [name] is a 43 y.o. male coming in with complaint of back and neck pain. OMT 02/13/2024. Patient states   Medications patient has been prescribed: None  Taking:         Reviewed prior external information including notes and imaging from previsou exam, outside providers and external EMR if available.   As well as notes that were available from care everywhere and other healthcare systems.  Past medical history, social, surgical and family history all reviewed in electronic medical record.  No pertanent information unless stated regarding to the chief complaint.   Past Medical History:  Diagnosis Date   Cataract 2003   Removed   Heart murmur    Palpitation     No Known Allergies   Review of Systems:  No headache, visual changes, nausea, vomiting, diarrhea, constipation, dizziness, abdominal pain, skin rash, fevers, chills, night sweats, weight loss, swollen lymph nodes, body aches, joint swelling, chest pain, shortness of breath, mood changes. POSITIVE muscle aches  Objective  Blood pressure 128/84, pulse 79, height 6' 3 (1.905 m), weight 261 lb (118.4 kg), SpO2 98%.   General: No apparent distress alert and oriented x3 mood and affect normal, dressed appropriately.  HEENT: Pupils equal, extraocular movements intact  Respiratory: Patient's speak in full sentences and does not appear short of breath  Cardiovascular: No lower extremity edema, non tender, no erythema  Neck still has some loss of lordosis.  Does still have some tightness noted with some left-sided sidebending and right sided rotation.  Osteopathic findings  C3 flexed rotated and side bent right C6  flexed rotated and side bent left T3 extended rotated and side bent left inhaled rib       Assessment and Plan:  Right cervical radiculopathy Significant improvement at this time.  Continuing with little more of the exercises that anything else at this time.  Discussed icing regimen and exercises, discussed continuing to work on posture and ergonomics throughout the day.  Follow-up again in 6 to 8 weeks otherwise.    Nonallopathic problems  Decision today to treat with OMT was based on Physical Exam  After verbal consent patient was treated with HVLA, ME, FPR techniques in cervical, rib, thoracic,  areas  Patient tolerated the procedure well with improvement in symptoms  Patient given exercises, stretches and lifestyle modifications  See medications in patient instructions if given  Patient will follow up in 4-8 weeks    The above documentation has been reviewed and is accurate and complete Andrew Vanhorn M Grier Vu, DO          Note: This dictation was prepared with Dragon dictation along with smaller phrase technology. Any transcriptional errors that result from this process are unintentional.

## 2024-04-14 ENCOUNTER — Encounter: Payer: Self-pay | Admitting: Family Medicine

## 2024-04-14 ENCOUNTER — Ambulatory Visit: Admitting: Family Medicine

## 2024-04-14 VITALS — BP 128/84 | HR 79 | Ht 75.0 in | Wt 261.0 lb

## 2024-04-14 DIAGNOSIS — M9908 Segmental and somatic dysfunction of rib cage: Secondary | ICD-10-CM

## 2024-04-14 DIAGNOSIS — M9901 Segmental and somatic dysfunction of cervical region: Secondary | ICD-10-CM | POA: Diagnosis not present

## 2024-04-14 DIAGNOSIS — M5412 Radiculopathy, cervical region: Secondary | ICD-10-CM

## 2024-04-14 DIAGNOSIS — M9902 Segmental and somatic dysfunction of thoracic region: Secondary | ICD-10-CM

## 2024-04-14 NOTE — Assessment & Plan Note (Signed)
 Significant improvement at this time.  Continuing with little more of the exercises that anything else at this time.  Discussed icing regimen and exercises, discussed continuing to work on posture and ergonomics throughout the day.  Follow-up again in 6 to 8 weeks otherwise.

## 2024-04-14 NOTE — Patient Instructions (Signed)
 Great to see you Keep each other updated on [facility] tickets See me in 3 months

## 2024-05-07 ENCOUNTER — Telehealth: Payer: Self-pay

## 2024-05-07 NOTE — Telephone Encounter (Signed)
 [phone] Pt returned call to schedule procedure.

## 2024-05-13 ENCOUNTER — Other Ambulatory Visit: Payer: Self-pay

## 2024-05-13 ENCOUNTER — Telehealth: Payer: Self-pay

## 2024-05-13 DIAGNOSIS — Z1211 Encounter for screening for malignant neoplasm of colon: Secondary | ICD-10-CM

## 2024-05-13 MED ORDER — NA SULFATE-K SULFATE-MG SULF 17.5-3.13-1.6 GM/177ML PO SOLN: 1.0000 | Freq: Once | ORAL | 0 refills | Status: AC

## 2024-05-13 NOTE — Telephone Encounter (Signed)
 Gastroenterology Pre-Procedure Review  Request Date: 07/13/24 Requesting Physician: Dr. Melany  PATIENT REVIEW QUESTIONS: The patient responded to the following health history questions as indicated:    1. Are you having any GI issues? no 2. Do you have a personal history of Polyps? no 3. Do you have a family history of Colon Cancer or Polyps? no 4. Diabetes Mellitus? no 5. Joint replacements in the past 12 months?no 6. Major health problems in the past 3 months?no 7. Any artificial heart valves, MVP, or defibrillator?no    MEDICATIONS & ALLERGIES:    Patient reports the following regarding taking any anticoagulation/antiplatelet therapy:   Plavix, Coumadin, Eliquis, Xarelto, Lovenox, Pradaxa, Brilinta, or Effient? no Aspirin? no  Patient confirms/reports the following medications:  Current Outpatient Medications  Medication Sig Dispense Refill   Na Sulfate-K Sulfate-Mg Sulfate concentrate (SUPREP) 17.5-3.13-1.6 GM/177ML SOLN Take 1 kit (354 mLs total) by mouth once for 1 dose. 354 mL 0   sildenafil  (VIAGRA ) 25 MG tablet Take 1 tablet (25 mg total) by mouth daily as needed for erectile dysfunction. 10 tablet 1   No current facility-administered medications for this visit.    Patient confirms/reports the following allergies:  No Known Allergies  No orders of the defined types were placed in this encounter.   AUTHORIZATION INFORMATION Primary Insurance: 1D#: Group #:  Secondary Insurance: 1D#: Group #:  SCHEDULE INFORMATION: Date: 07/13/24 Time: Location: [facility]

## 2024-05-21 NOTE — Telephone Encounter (Signed)
 Procedure was scheduled for 07/11/2024 by Rosaline Ming, CMA. Please read above the other patient's note.

## 2024-06-24 NOTE — Telephone Encounter (Signed)
 Voice message has been left informing patient that due to scheduling error I had to reschedule his colonoscopy.   He was supposed to been scheduled on 07/11/24 at [facility] instead I scheduled at [facility] date was also incorrect.  Left message for pt informing him that I have rescheduled him to 07/18/24 at [facility] with Dr. Melany.  Asked him to call me back if this date did not work for him.  Thanks,  [place], CMA

## 2024-07-15 NOTE — Progress Notes (Unsigned)
" °  [name] [address] [place] [address] Phone: [phone] Subjective:   Andrew [name], am serving as a scribe for Dr. Arthea Claudene.  I'm seeing this patient by the request  of:  Wendee Lynwood HERO, NP  CC: Back and neck pain follow-up  YEP:Dlagzrupcz  Andrew [name] is a 44 y.o. male coming in with complaint of back and neck pain. OMT 04/14/2024. Patient states that he is having stiffness in R shoulder and down into his arm.   Medications patient has been prescribed: None  Taking:         Reviewed prior external information including notes and imaging from previsou exam, outside providers and external EMR if available.   As well as notes that were available from care everywhere and other healthcare systems.  Past medical history, social, surgical and family history all reviewed in electronic medical record.  No pertanent information unless stated regarding to the chief complaint.   Past Medical History:  Diagnosis Date   Cataract 2003   Removed   GERD (gastroesophageal reflux disease)    not on meds   Heart murmur    History of kidney stones    Palpitation     Allergies[1]   Review of Systems:  No headache, visual changes, nausea, vomiting, diarrhea, constipation, dizziness, abdominal pain, skin rash, fevers, chills, night sweats, weight loss, swollen lymph nodes, body aches, joint swelling, chest pain, shortness of breath, mood changes. POSITIVE muscle aches  Objective  There were no vitals taken for this visit.   General: No apparent distress alert and oriented x3 mood and affect normal, dressed appropriately.  HEENT: Pupils equal, extraocular movements intact  Respiratory: Patient's speak in full sentences and does not appear short of breath  Cardiovascular: No lower extremity edema, non tender, no erythema  Gait MSK:  Back does have some mild loss of lordosis of lumbar spine but nothing severe.  Neck exam still  tightness more noted on the right than the left.  Negative Spurling's noted.  Osteopathic findings  C2 flexed rotated and side bent right C6 flexed rotated and side bent right T3 extended rotated and side bent right inhaled rib        Assessment and Plan:  No problem-specific Assessment & Plan notes found for this encounter.    Nonallopathic problems  Decision today to treat with OMT was based on Physical Exam  After verbal consent patient was treated with HVLA, ME, FPR techniques in cervical, rib, thoracic areas  Patient tolerated the procedure well with improvement in symptoms  Patient given exercises, stretches and lifestyle modifications  See medications in patient instructions if given  Patient will follow up in 4-8 weeks     The above documentation has been reviewed and is accurate and complete Jeptha Hinnenkamp M Ashur Glatfelter, DO         Note: This dictation was prepared with Dragon dictation along with smaller phrase technology. Any transcriptional errors that result from this process are unintentional.            [1] No Known Allergies  "

## 2024-07-16 ENCOUNTER — Encounter: Payer: Self-pay | Admitting: Gastroenterology

## 2024-07-17 ENCOUNTER — Ambulatory Visit: Admitting: Family Medicine

## 2024-07-17 ENCOUNTER — Encounter: Payer: Self-pay | Admitting: Family Medicine

## 2024-07-17 VITALS — BP 122/82 | HR 75 | Ht 75.0 in

## 2024-07-17 DIAGNOSIS — M9908 Segmental and somatic dysfunction of rib cage: Secondary | ICD-10-CM | POA: Diagnosis not present

## 2024-07-17 DIAGNOSIS — M9902 Segmental and somatic dysfunction of thoracic region: Secondary | ICD-10-CM

## 2024-07-17 DIAGNOSIS — M9901 Segmental and somatic dysfunction of cervical region: Secondary | ICD-10-CM

## 2024-07-17 DIAGNOSIS — M5412 Radiculopathy, cervical region: Secondary | ICD-10-CM | POA: Diagnosis not present

## 2024-07-17 NOTE — Patient Instructions (Signed)
 Watch shoulder See me in 3-4 months

## 2024-07-17 NOTE — Assessment & Plan Note (Signed)
 Chronic problem, discussed icing regimen and home exercises, which activities to do and which ones to avoid.  Increase activity slowly.  Discussed which activities to do and which ones to avoid.  Taking any medication at the moment.  Follow-up again in 3 to 4 months

## 2024-07-17 NOTE — H&P (Signed)
 "  Andrew Schaffer, MD  [address] [place], [place] [address] Phone: [phone] Fax : [phone]  Primary Care Physician:  Wendee Lynwood HERO, NP Primary Gastroenterologist:  Dr. Schaffer  Pre-Procedure History & Physical: HPI:  Andrew [name] is a 44 y.o. male is here for a screening colonoscopy.  Prior colonoscopy? Fhx CRC? Blood thinners?  Past Medical History:  Diagnosis Date   Cataract 2003   Removed   GERD (gastroesophageal reflux disease)    not on meds   Heart murmur    History of kidney stones    Palpitation     Past Surgical History:  Procedure Laterality Date   APPENDECTOMY     CATARACT EXTRACTION, BILATERAL      Prior to Admission medications  Medication Sig Start Date End Date Taking? Authorizing Provider  sildenafil  (VIAGRA ) 25 MG tablet Take 1 tablet (25 mg total) by mouth daily as needed for erectile dysfunction. 03/22/23   Wendee Lynwood HERO, NP    Allergies as of 05/13/2024   (No Known Allergies)    Family History  Problem Relation Age of Onset   Arthritis Mother    Hypertension Father    Depression Father    Hyperlipidemia Father    Dementia Maternal Grandmother    Cancer Paternal Grandmother    Depression Sister    Depression Brother    Prostate cancer Neg Hx    Kidney failure Neg Hx     Social History   Socioeconomic History   Marital status: Married    Spouse name: Andrew [name]   Number of children: 4   Years of education: Not on file   Highest education level: Master's degree (e.g., MA, MS, MEng, MEd, MSW, MBA)  Occupational History   Not on file  Tobacco Use   Smoking status: Never   Smokeless tobacco: Former    Quit date: 06/26/2001  Vaping Use   Vaping status: Never Used  Substance and Sexual Activity   Alcohol use: Not Currently   Drug use: Never   Sexual activity: Yes  Other Topics Concern   Not on file  Social History Narrative   Fulltime: VP of workforce for [employer]   Social Drivers of Health   Tobacco Use:  Medium Risk (07/17/2024)   Patient History    Smoking Tobacco Use: Never    Smokeless Tobacco Use: Former    Passive Exposure: Not on Actuary Strain: Not on file  Food Insecurity: Not on file  Transportation Needs: Not on file  Physical Activity: Not on file  Stress: Not on file  Social Connections: Not on file  Intimate Partner Violence: Not on file  Depression (PHQ2-9): Low Risk (03/11/2024)   Depression (PHQ2-9)    PHQ-2 Score: 1  Alcohol Screen: Not on file  Housing: Not on file  Utilities: Not on file  Health Literacy: Not on file    Review of Systems: See HPI, otherwise negative ROS  Physical Exam: Ht 6' 3 (1.905 m)   Wt 118.4 kg   BMI 32.62 kg/m  CONSTITUTIONAL: Well-appearing in no acute distress.  HEENT: Pupils equal, round, Extraocular movements intact. Conjunctivae clear NECK: Neck supple CARDIOVASCULAR: Regular rate, no LE edema  RESPIRATORY: No labored breathing  ABDOMEN: Abdomen soft, nontender, not distended, no guarding, no rigidity SKIN: No apparent skin rashes or lesions. NEUROLOGIC: Normal speech, no focal findings. Mental status alert and oriented x4. PSYCHIATRIC: Mood and affect normal.   Impression/Plan: Andrew [name] is now here to  undergo a screening colonoscopy.  Risks, benefits, and alternatives regarding colonoscopy have been reviewed with the patient.  Questions have been answered.  All parties agreeable.  "

## 2024-07-18 ENCOUNTER — Encounter: Payer: Self-pay | Admitting: Gastroenterology

## 2024-07-18 ENCOUNTER — Encounter
Admission: RE | Disposition: A | Discharge status: Home / Self Care | Payer: Self-pay | Source: Home / Self Care | Attending: Gastroenterology

## 2024-07-18 ENCOUNTER — Ambulatory Visit: Payer: Self-pay | Admitting: Anesthesiology

## 2024-07-18 ENCOUNTER — Other Ambulatory Visit: Payer: Self-pay

## 2024-07-18 ENCOUNTER — Ambulatory Visit
Admission: RE | Admit: 2024-07-18 | Discharge: 2024-07-18 | Disposition: A | Discharge status: Home / Self Care | Attending: Gastroenterology | Admitting: Gastroenterology

## 2024-07-18 DIAGNOSIS — Z1211 Encounter for screening for malignant neoplasm of colon: Secondary | ICD-10-CM | POA: Diagnosis present

## 2024-07-18 DIAGNOSIS — E669 Obesity, unspecified: Secondary | ICD-10-CM | POA: Diagnosis not present

## 2024-07-18 DIAGNOSIS — Z6830 Body mass index (BMI) 30.0-30.9, adult: Secondary | ICD-10-CM | POA: Diagnosis not present

## 2024-07-18 DIAGNOSIS — K219 Gastro-esophageal reflux disease without esophagitis: Secondary | ICD-10-CM | POA: Diagnosis not present

## 2024-07-18 DIAGNOSIS — K635 Polyp of colon: Secondary | ICD-10-CM

## 2024-07-18 DIAGNOSIS — D125 Benign neoplasm of sigmoid colon: Secondary | ICD-10-CM | POA: Diagnosis not present

## 2024-07-18 DIAGNOSIS — K640 First degree hemorrhoids: Secondary | ICD-10-CM | POA: Diagnosis not present

## 2024-07-18 HISTORY — DX: Personal history of urinary calculi: Z87.442

## 2024-07-18 HISTORY — DX: Gastro-esophageal reflux disease without esophagitis: K21.9

## 2024-07-18 MED ORDER — ONDANSETRON HCL 4 MG/2ML IJ SOLN: 4.0000 mg | Freq: Once | INTRAMUSCULAR | Status: DC | PRN

## 2024-07-18 MED ORDER — SODIUM CHLORIDE 0.9 % IV SOLN: INTRAVENOUS | Status: DC

## 2024-07-18 MED ORDER — LIDOCAINE HCL (CARDIAC) PF 100 MG/5ML IV SOSY
PREFILLED_SYRINGE | INTRAVENOUS | Status: DC | PRN
  Administered 2024-07-18: 20 mg via INTRAVENOUS

## 2024-07-18 MED ORDER — STERILE WATER FOR IRRIGATION IR SOLN: Status: DC | PRN

## 2024-07-18 MED ORDER — STERILE WATER FOR IRRIGATION IR SOLN
Status: DC | PRN
  Administered 2024-07-18: 250 mL

## 2024-07-18 MED ORDER — LACTATED RINGERS IV SOLN: INTRAVENOUS | Status: DC

## 2024-07-18 MED ORDER — PROPOFOL 1000 MG/100ML IV EMUL
INTRAVENOUS | Status: AC
  Filled 2024-07-18: qty 100

## 2024-07-18 MED ORDER — PROPOFOL 10 MG/ML IV BOLUS
INTRAVENOUS | Status: DC | PRN
  Administered 2024-07-18: 50 mg via INTRAVENOUS
  Administered 2024-07-18: 130 mg via INTRAVENOUS
  Administered 2024-07-18: 50 mg via INTRAVENOUS
  Administered 2024-07-18: 30 mg via INTRAVENOUS
  Administered 2024-07-18 (×2): 20 mg via INTRAVENOUS

## 2024-07-18 NOTE — Anesthesia Preprocedure Evaluation (Addendum)
"                                    Anesthesia Evaluation  Patient identified by MRN, date of birth, ID band Patient awake    Reviewed: Allergy & Precautions, NPO status , Patient's Chart, lab work & pertinent test results  History of Anesthesia Complications Negative for: history of anesthetic complications  Airway Mallampati: III   Neck ROM: Full    Dental no notable dental hx.    Pulmonary neg pulmonary ROS   Pulmonary exam normal breath sounds clear to auscultation       Cardiovascular Exercise Tolerance: Good negative cardio ROS Normal cardiovascular exam Rhythm:Regular Rate:Normal     Neuro/Psych negative neurological ROS     GI/Hepatic ,GERD  ,,  Endo/Other  Obesity   Renal/GU Renal disease (nephrolithiasis)     Musculoskeletal   Abdominal   Peds  Hematology negative hematology ROS (+)   Anesthesia Other Findings   Reproductive/Obstetrics                              Anesthesia Physical Anesthesia Plan  ASA: 2  Anesthesia Plan: General   Post-op Pain Management:    Induction: Intravenous  PONV Risk Score and Plan: 2 and Propofol  infusion, TIVA and Treatment may vary due to age or medical condition  Airway Management Planned: Natural Airway  Additional Equipment:   Intra-op Plan:   Post-operative Plan:   Informed Consent: I have reviewed the patients History and Physical, chart, labs and discussed the procedure including the risks, benefits and alternatives for the proposed anesthesia with the patient or authorized representative who has indicated his/her understanding and acceptance.       Plan Discussed with: CRNA  Anesthesia Plan Comments: (LMA/GETA backup discussed.  Patient consented for risks of anesthesia including but not limited to:  - adverse reactions to medications - damage to eyes, teeth, lips or other oral mucosa - nerve damage due to positioning  - sore throat or hoarseness -  damage to heart, brain, nerves, lungs, other parts of body or loss of life  Informed patient about role of CRNA in peri- and intra-operative care.  Patient voiced understanding.)         Anesthesia Quick Evaluation  "

## 2024-07-18 NOTE — Anesthesia Postprocedure Evaluation (Signed)
"   Anesthesia Post Note  Patient: Andrew [name]  Procedure(s) Performed: COLONOSCOPY (Rectum) POLYPECTOMY, INTESTINE (Rectum)  Patient location during evaluation: PACU Anesthesia Type: General Level of consciousness: awake and alert, oriented and patient cooperative Pain management: pain level controlled Vital Signs Assessment: post-procedure vital signs reviewed and stable Respiratory status: spontaneous breathing, nonlabored ventilation and respiratory function stable Cardiovascular status: blood pressure returned to baseline and stable Postop Assessment: adequate PO intake Anesthetic complications: no   No notable events documented.   Last Vitals:  Vitals:   07/18/24 1315 07/18/24 1321  BP: 112/76 127/82  Pulse: 81 82  Resp: 17 17  Temp: 37.4 C 37.4 C  SpO2: 98% 97%    Last Pain:  Vitals:   07/18/24 1321  TempSrc:   PainSc: 0-No pain                 Alfonso Ruths      "

## 2024-07-18 NOTE — Transfer of Care (Signed)
 Immediate Anesthesia Transfer of Care Note  Patient: Andrew [name]  Procedure(s) Performed: COLONOSCOPY (Rectum) POLYPECTOMY, INTESTINE (Rectum)  Patient Location: PACU  Anesthesia Type: General  Level of Consciousness: awake, alert  and patient cooperative  Airway and Oxygen Therapy: Patient Spontanous Breathing and Patient connected to supplemental oxygen  Post-op Assessment: Post-op Vital signs reviewed, Patient's Cardiovascular Status Stable, Respiratory Function Stable, Patent Airway and No signs of Nausea or vomiting  Post-op Vital Signs: Reviewed and stable  Complications: No notable events documented.

## 2024-07-18 NOTE — Op Note (Signed)
 [facility] Gastroenterology Patient Name: Andrew [name] Procedure Date: 07/18/2024 12:49 PM MRN: [id number] Account #: [id number] Date of Birth: 02-Aug-1980 Admit Type: Outpatient Age: 44 Room: [facility] OR ROOM 01 Gender: Male Note Status: Finalized Instrument Name: Colonoscope [id number] Procedure:             Colonoscopy Indications:           Screening for colorectal malignant neoplasm Providers:             [name], MD Referring MD:          Lynwood HERO. Cable (Referring MD) Medicines:             Propofol  per Anesthesia Complications:         No immediate complications. Procedure:             Pre-Anesthesia Assessment:                        - Prior to the procedure, a History and Physical was                         performed, and patient medications and allergies were                         reviewed. The patient's tolerance of previous                         anesthesia was also reviewed. The risks and benefits                         of the procedure and the sedation options and risks                         were discussed with the patient. All questions were                         answered, and informed consent was obtained. Prior                         Anticoagulants: The patient has taken no anticoagulant                         or antiplatelet agents. ASA Grade Assessment: II - A                         patient with mild systemic disease. After reviewing                         the risks and benefits, the patient was deemed in                         satisfactory condition to undergo the procedure.                        After obtaining informed consent, the colonoscope was                         passed under direct vision. Throughout the procedure,  the patient's blood pressure, pulse, and oxygen                         saturations were monitored continuously. The                         Colonoscope was introduced through the  anus and                         advanced to the 10 cm into the ileum. The terminal                         ileum, ileocecal valve, appendiceal orifice, and                         rectum were photographed. Findings:      A 6 mm polyp was found in the sigmoid colon. The polyp was sessile. The       polyp was removed with a cold snare. Resection and retrieval were       complete. Estimated blood loss: none.      Internal hemorrhoids were found during retroflexion. The hemorrhoids       were Grade I (internal hemorrhoids that do not prolapse).      The terminal ileum appeared normal. Impression:            - One 6 mm polyp in the sigmoid colon, removed with a                         cold snare. Resected and retrieved.                        - Internal hemorrhoids.                        - The examined portion of the ileum was normal. Recommendation:        - Patient has a contact number available for                         emergencies. The signs and symptoms of potential                         delayed complications were discussed with the patient.                         Return to normal activities tomorrow. Written                         discharge instructions were provided to the patient.                        - High fiber diet.                        - Continue present medications.                        - Await pathology results.                        -  Repeat colonoscopy in 5-10 years for surveillance                         based on pathology results.                        - The findings and recommendations were discussed with                         the designated responsible adult. Procedure Code(s):     --- Professional ---                        [phone], Colonoscopy, flexible; with removal of                         tumor(s), polyp(s), or other lesion(s) by snare                         technique Diagnosis Code(s):     --- Professional ---                        Z12.11,  Encounter for screening for malignant neoplasm                         of colon                        D12.5, Benign neoplasm of sigmoid colon                        K64.0, First degree hemorrhoids CPT copyright 2022 [employer]. All rights reserved. The codes documented in this report are preliminary and upon coder review may  be revised to meet current compliance requirements. [name], MD 07/18/2024 1:14:04 PM Number of Addenda: 0 Note Initiated On: 07/18/2024 12:49 PM Scope Withdrawal Time: 0 hours 7 minutes 22 seconds  Total Procedure Duration: 0 hours 10 minutes 3 seconds  Estimated Blood Loss:  Estimated blood loss: none.      [facility]

## 2024-07-23 LAB — SURGICAL PATHOLOGY

## 2024-07-24 ENCOUNTER — Ambulatory Visit: Payer: Self-pay | Admitting: Gastroenterology

## 2024-10-16 ENCOUNTER — Ambulatory Visit: Admitting: Family Medicine
